# Patient Record
Sex: Female | Born: 1960 | Race: White | Hispanic: No | Marital: Married | Smoking: Never smoker
Health system: Southern US, Academic
[De-identification: ages and names within clinical notes are randomized; demographics above are authoritative.]

## PROBLEM LIST (undated history)

## (undated) DIAGNOSIS — C8590 Non-Hodgkin lymphoma, unspecified, unspecified site: Secondary | ICD-10-CM

## (undated) DIAGNOSIS — M159 Polyosteoarthritis, unspecified: Secondary | ICD-10-CM

## (undated) DIAGNOSIS — G4730 Sleep apnea, unspecified: Secondary | ICD-10-CM

## (undated) DIAGNOSIS — C801 Malignant (primary) neoplasm, unspecified: Secondary | ICD-10-CM

## (undated) DIAGNOSIS — D693 Immune thrombocytopenic purpura: Secondary | ICD-10-CM

## (undated) DIAGNOSIS — L439 Lichen planus, unspecified: Secondary | ICD-10-CM

## (undated) DIAGNOSIS — N200 Calculus of kidney: Secondary | ICD-10-CM

## (undated) DIAGNOSIS — Z22322 Carrier or suspected carrier of Methicillin resistant Staphylococcus aureus: Secondary | ICD-10-CM

## (undated) DIAGNOSIS — K219 Gastro-esophageal reflux disease without esophagitis: Secondary | ICD-10-CM

## (undated) DIAGNOSIS — M129 Arthropathy, unspecified: Secondary | ICD-10-CM

## (undated) DIAGNOSIS — K911 Postgastric surgery syndromes: Secondary | ICD-10-CM

## (undated) HISTORY — PX: HX GALL BLADDER SURGERY/CHOLE: SHX55

## (undated) HISTORY — PX: HX HYSTERECTOMY: SHX81

---

## 2018-09-23 ENCOUNTER — Other Ambulatory Visit (HOSPITAL_COMMUNITY): Payer: Self-pay

## 2020-05-03 IMAGING — CT CT ABDOMEN & PELVIS WITH DYE
3 of 7 series · 13 of 46 positions shown, 19 images · IV contrast (CONTRAST)
Comparison: CT abdomen pelvis report from outside facility dated 09/01/2019.

﻿EXAM:  CT ABDOMEN & PELVIS WITH DYE
INDICATION: Non-Hodgkin's lymphoma.
TECHNIQUE: Axial CT imaging of the abdomen and pelvis was performed following intravenous administration of 100 mL of Optiray 300. Oral contrast was also administered. Images were reviewed in multiple windows and projections. Exam was performed using 1 or more of the following dose reduction techniques: Automated exposure control, adjustment of the mA and/or kV according to patient size, or the use of iterative reconstruction technique.

[ven · axial · 0.84mm/px · z∈[-1300,-926]mm · 9 of 235 slices shown, 15 images]
[im 24/235  soft-tissue]
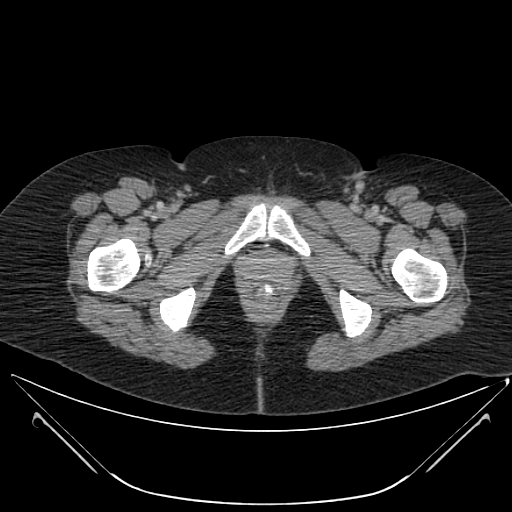
[im 24/235  bone]
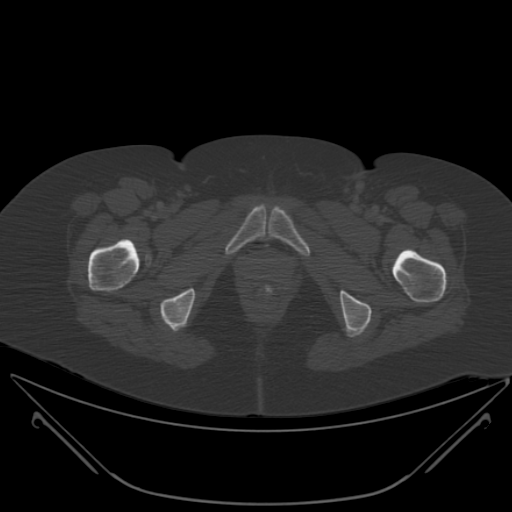
[im 47/235  soft-tissue]
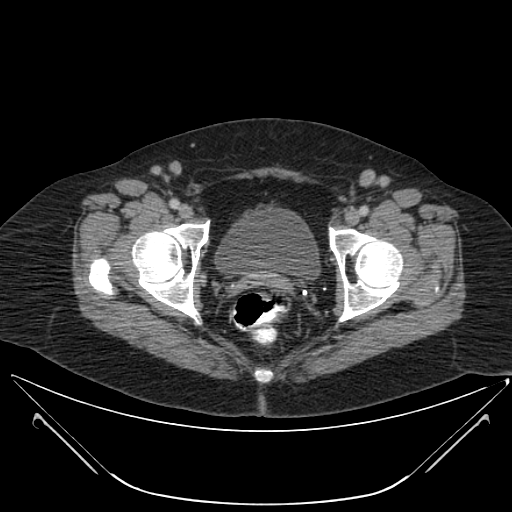
[im 71/235  soft-tissue]
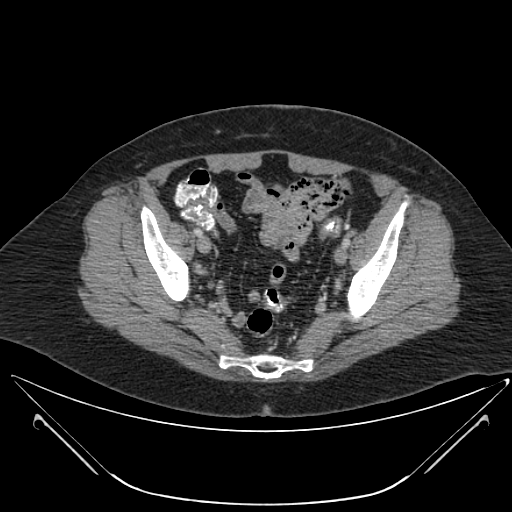
[im 94/235  soft-tissue]
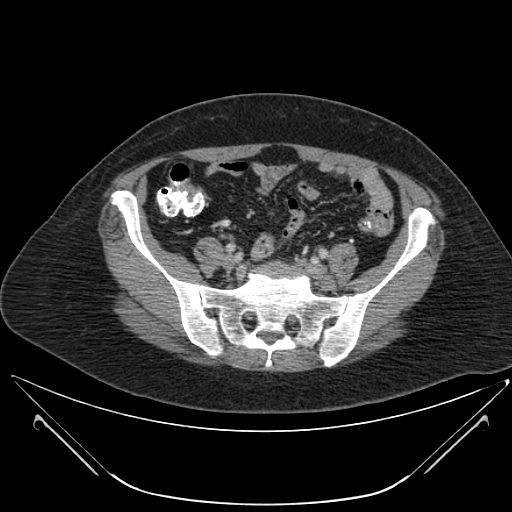
[im 118/235  soft-tissue]
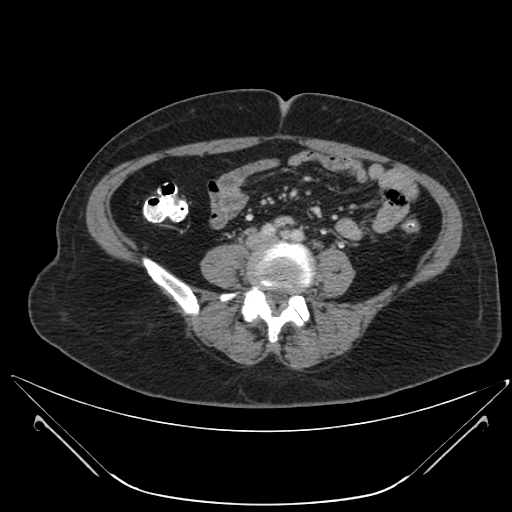
[im 141/235  soft-tissue]
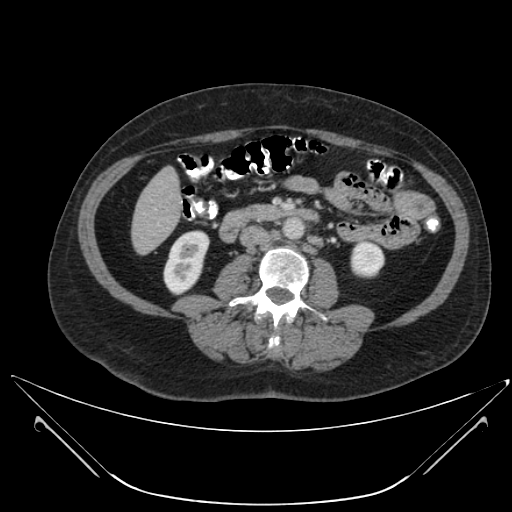
[im 141/235  lung]
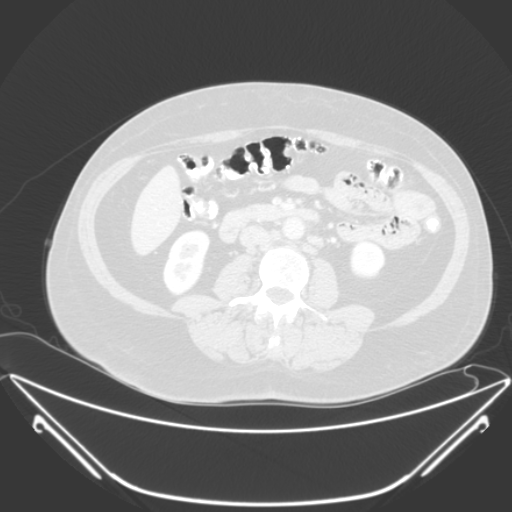
[im 164/235  soft-tissue]
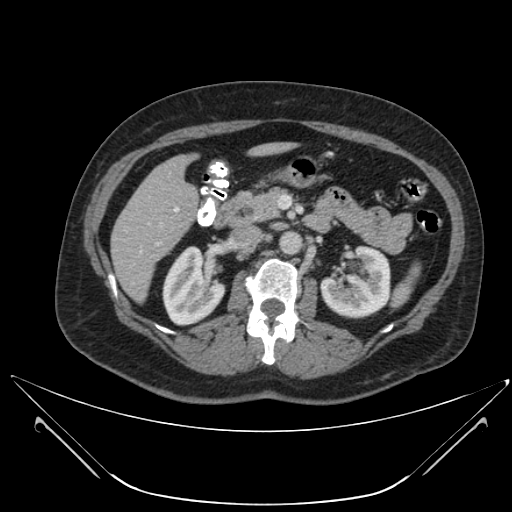
[im 164/235  lung]
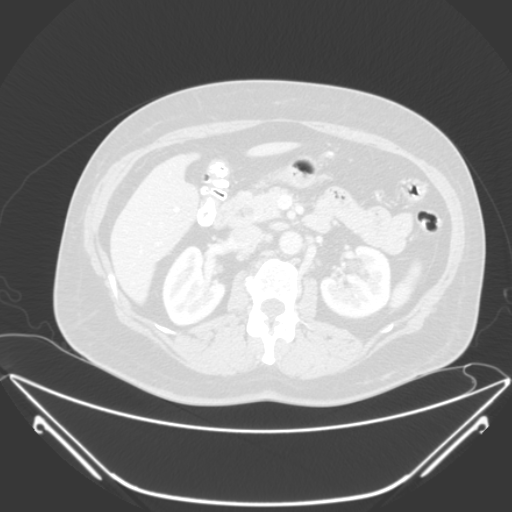
[im 188/235  soft-tissue]
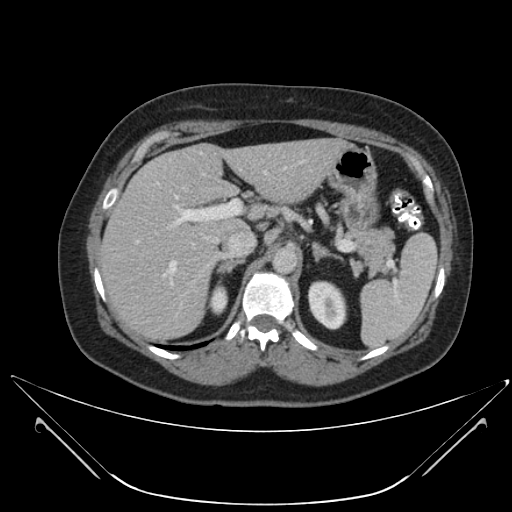
[im 188/235  lung]
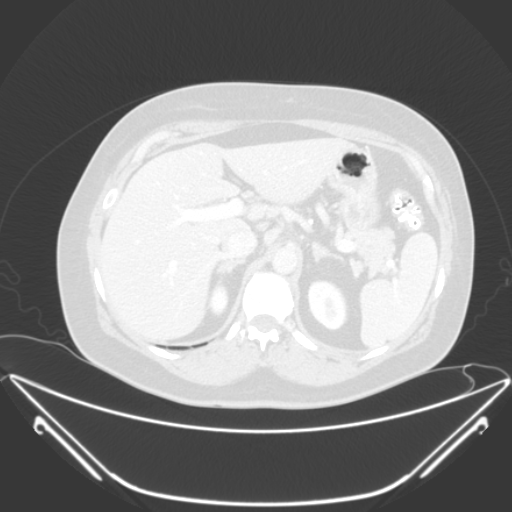
[im 211/235  soft-tissue]
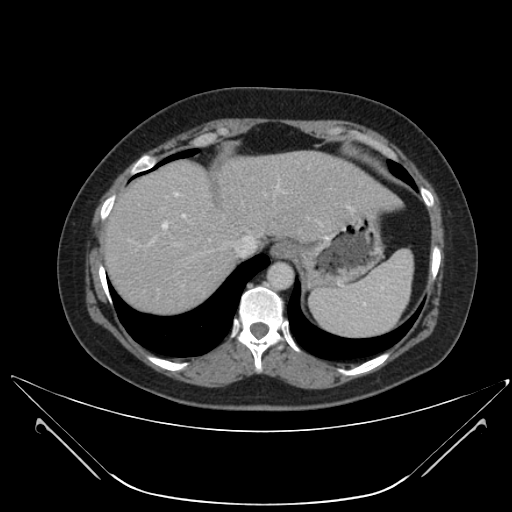
[im 211/235  lung]
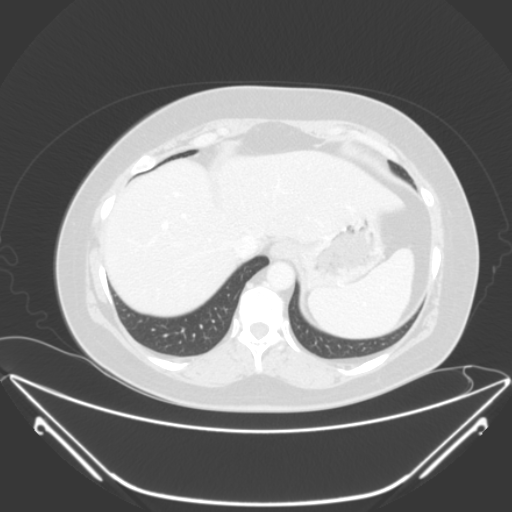
[im 211/235  bone]
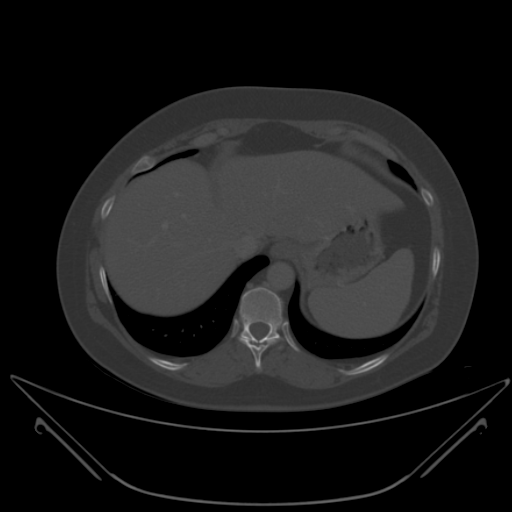

[cor · coronal · 0.99mm/px · 3 of 68 slices shown]
[im 23/68  soft-tissue]
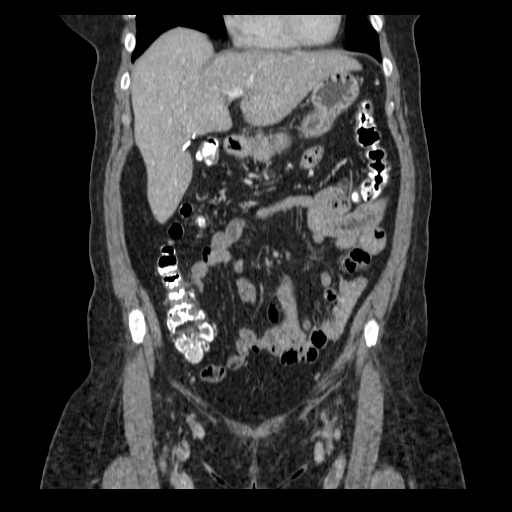
[im 30/68  soft-tissue]
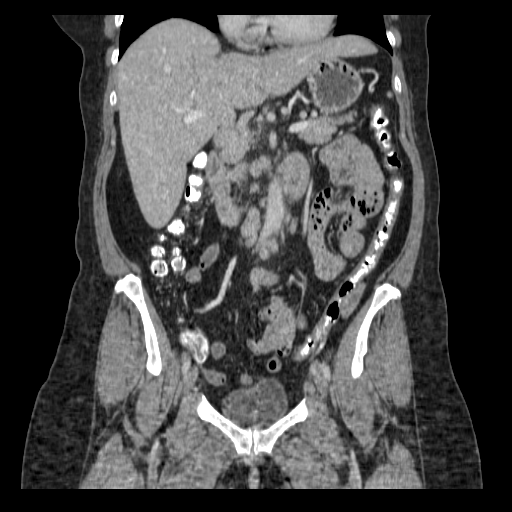
[im 38/68  soft-tissue]
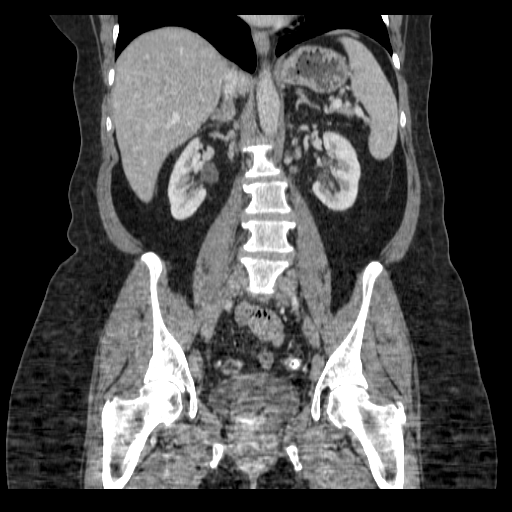

[sag · sagittal · 0.99mm/px · 1 of 97 slices shown]
[im 33/97  soft-tissue]
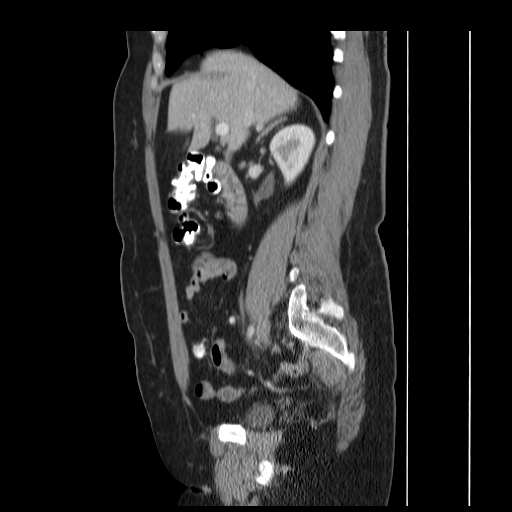

[13 of 46 positions shown; findings below may reference images not displayed]

FINDINGS: Limited visualization of lung bases is unremarkable. There is no pleural or pericardial effusion.

Gallbladder is surgically absent. Liver, spleen, pancreas, adrenal glands and kidneys are normal.

Bowel loops are normal in course and caliber, there is no obstruction or free air. Appendix is normal. 

There is mild retroperitoneal adenopathy, measuring up to 2.1 x 1.0 cm within the right para-aortic region distally. There is abnormal pelvic sidewall adenopathy, measuring up to 2.0 x 3.5 cm on the right and 2.6 x 2.0 cm on the left side. There is also abnormal bilateral inguinal adenopathy, measuring up to 2.1 x 1.7 cm on the right and 3.0 x 1.6 cm on the left side. There is no ascites. There are mild multilevel arthritic changes within the lumbar spine.
IMPRESSION: 1. Abnormal retroperitoneal, pelvic sidewall and inguinal adenopathy as detailed above. 

2. No acute abnormality within the upper abdomen and pelvis.

## 2021-09-24 ENCOUNTER — Other Ambulatory Visit (HOSPITAL_COMMUNITY): Payer: Self-pay

## 2021-09-24 DIAGNOSIS — M542 Cervicalgia: Secondary | ICD-10-CM

## 2021-10-02 ENCOUNTER — Other Ambulatory Visit (HOSPITAL_COMMUNITY): Payer: Self-pay

## 2021-10-02 DIAGNOSIS — C8208 Follicular lymphoma grade I, lymph nodes of multiple sites: Secondary | ICD-10-CM

## 2021-10-11 ENCOUNTER — Other Ambulatory Visit: Payer: Self-pay

## 2021-10-11 ENCOUNTER — Encounter (HOSPITAL_COMMUNITY): Payer: Self-pay

## 2021-10-11 ENCOUNTER — Ambulatory Visit (HOSPITAL_COMMUNITY)
Admission: RE | Admit: 2021-10-11 | Discharge: 2021-10-11 | Disposition: A | Discharge status: Still In Hospital | Payer: 59 | Source: Ambulatory Visit

## 2021-10-11 DIAGNOSIS — M542 Cervicalgia: Secondary | ICD-10-CM | POA: Insufficient documentation

## 2021-10-11 DIAGNOSIS — G8929 Other chronic pain: Secondary | ICD-10-CM | POA: Insufficient documentation

## 2021-10-11 DIAGNOSIS — T84216A Breakdown (mechanical) of internal fixation device of vertebrae, initial encounter: Secondary | ICD-10-CM | POA: Insufficient documentation

## 2021-10-11 DIAGNOSIS — M545 Low back pain, unspecified: Secondary | ICD-10-CM | POA: Insufficient documentation

## 2021-10-16 ENCOUNTER — Other Ambulatory Visit: Payer: Self-pay

## 2021-10-16 ENCOUNTER — Ambulatory Visit (HOSPITAL_BASED_OUTPATIENT_CLINIC_OR_DEPARTMENT_OTHER)
Admission: RE | Admit: 2021-10-16 | Discharge: 2021-10-16 | Disposition: A | Discharge status: Home / Self Care | Payer: Self-pay | Source: Ambulatory Visit

## 2021-10-16 ENCOUNTER — Ambulatory Visit
Admission: RE | Admit: 2021-10-16 | Discharge: 2021-10-16 | Disposition: A | Discharge status: Home / Self Care | Payer: Self-pay | Source: Ambulatory Visit

## 2021-10-16 ENCOUNTER — Ambulatory Visit (HOSPITAL_BASED_OUTPATIENT_CLINIC_OR_DEPARTMENT_OTHER): Payer: Self-pay

## 2021-10-16 DIAGNOSIS — C8208 Follicular lymphoma grade I, lymph nodes of multiple sites: Secondary | ICD-10-CM | POA: Insufficient documentation

## 2021-10-16 LAB — CREATININE
CREATININE: 0.84 mg/dL (ref 0.55–1.02)
ESTIMATED GFR: 80 mL/min/{1.73_m2} (ref >59)

## 2021-10-29 ENCOUNTER — Other Ambulatory Visit (HOSPITAL_COMMUNITY): Payer: Self-pay

## 2021-10-29 DIAGNOSIS — Z1231 Encounter for screening mammogram for malignant neoplasm of breast: Secondary | ICD-10-CM

## 2021-11-01 ENCOUNTER — Ambulatory Visit (HOSPITAL_COMMUNITY)
Admission: RE | Admit: 2021-11-01 | Discharge: 2021-11-01 | Disposition: A | Discharge status: Still In Hospital | Payer: 59 | Source: Ambulatory Visit

## 2021-11-01 ENCOUNTER — Other Ambulatory Visit: Payer: Self-pay

## 2021-11-01 DIAGNOSIS — M542 Cervicalgia: Secondary | ICD-10-CM

## 2021-11-01 DIAGNOSIS — T84216A Breakdown (mechanical) of internal fixation device of vertebrae, initial encounter: Secondary | ICD-10-CM

## 2021-11-01 DIAGNOSIS — M545 Low back pain, unspecified: Secondary | ICD-10-CM
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
## 2021-11-03 ENCOUNTER — Other Ambulatory Visit: Payer: Self-pay

## 2021-11-04 ENCOUNTER — Ambulatory Visit
Admission: RE | Admit: 2021-11-04 | Discharge: 2021-11-04 | Disposition: A | Discharge status: Still In Hospital | Payer: 59 | Source: Ambulatory Visit

## 2021-11-04 ENCOUNTER — Other Ambulatory Visit: Payer: Self-pay

## 2021-11-04 ENCOUNTER — Other Ambulatory Visit (HOSPITAL_COMMUNITY): Payer: Self-pay

## 2021-11-04 DIAGNOSIS — T84216A Breakdown (mechanical) of internal fixation device of vertebrae, initial encounter: Secondary | ICD-10-CM

## 2021-11-04 DIAGNOSIS — M542 Cervicalgia: Secondary | ICD-10-CM

## 2021-11-04 DIAGNOSIS — M545 Low back pain, unspecified: Secondary | ICD-10-CM

## 2021-11-04 NOTE — PT Treatment (Signed)
[facility]  Outpatient Physical Therapy  [place], [address]  [phone]  [phone]    Physical Therapy Treatment Note    Date: 11/04/2021  Patient's Name: Deborah Vega  Date of Birth: [birth year]            Visit #/POC: 2  Authorization:  [id number]      Evaluating Physical Therapist: [name] PT  PT diagnosis/Reason for Referral: Low back pain, Cervicalgia with prior h/o ACDF in 2013  Next Scheduled Physician Appointment: 11/07/21          Subjective: Pt reports constant headache. Rates 5/10.  Notes this average and constant.  Notes not sleeping well at all. Is starting pelvic PT at [facility] this week.  Also going to [place] for follow up on Cancer diagnosis soon.      Objective: Started with gentle manual therapies and exercise noted below.        EXERCISE/ACTIVITY NAME REPETITIONS RESISTANCE COMPLETED THIS DOS   Suboccipital release   na Manual, medium pressure yes   Cervical flexion/chin tuck isometric   5 sec x 7 na Yes; HEP   Horizontal abduction supine   10 red Yes; HEP   Anterior chest release, gentle overpressure   na manual yes                                                 Assessment: Pt tolerated treatment well.  Noted no headache at end of session.  She does have soft tissue restriction suboccipital and chest.  Forward head and forward shoulder posturing contributing to pain.  Postural weakness noted.      Plan: Will monitor effects of treatment and proceed accordingly    Short Term Goals: 4  Weeks      -Independent home program. ROM / Stabilization / Self care / Activity modification.       -Knowledge of proper body mechanics.       -Decrease in pain. Intermittent vs. constant. Worst SPS rating less than 7.       -Increase ROM / Strength: POWER GRIP NOT PAIN INHIBITED.   -Improve function. Decreased sleep disruption / Reduce HAS / Reduce radicular symptoms.             Long Term Goals: 6-8  Weeks       -Abolish radicular symptoms. Worst SPS rating  less than 5.  -Increase ROM / Strength to [facility] to allow normal body mechanics with all mobility.  -Improve function. Restorative sleep / Abolish HAS / tolerance of static posture not limited by pain.       -Other :  Sustained use of UEs not limited by neck pain.       Total Session Time 33 and Timed code minutes 33  THERAPEUTIC EXERCISE 17 minutes and JOINT MOBILIZATION/MFR 16 minutes      Lessly Stigler, PTA  11/04/2021, 08:55

## 2021-11-05 ENCOUNTER — Ambulatory Visit (HOSPITAL_COMMUNITY): Payer: Self-pay

## 2021-11-06 ENCOUNTER — Inpatient Hospital Stay
Admission: RE | Admit: 2021-11-06 | Discharge: 2021-11-06 | Disposition: A | Discharge status: Home / Self Care | Payer: 59 | Source: Ambulatory Visit

## 2021-11-06 ENCOUNTER — Other Ambulatory Visit: Payer: Self-pay

## 2021-11-06 ENCOUNTER — Other Ambulatory Visit (HOSPITAL_COMMUNITY): Payer: Self-pay

## 2021-11-06 ENCOUNTER — Encounter (HOSPITAL_COMMUNITY): Payer: Self-pay

## 2021-11-06 DIAGNOSIS — Z1231 Encounter for screening mammogram for malignant neoplasm of breast: Secondary | ICD-10-CM

## 2021-11-08 ENCOUNTER — Ambulatory Visit (HOSPITAL_COMMUNITY): Payer: Self-pay

## 2021-11-11 ENCOUNTER — Ambulatory Visit (HOSPITAL_COMMUNITY)
Admission: RE | Admit: 2021-11-11 | Discharge: 2021-11-11 | Disposition: A | Discharge status: Still In Hospital | Payer: 59 | Source: Ambulatory Visit

## 2021-11-11 ENCOUNTER — Other Ambulatory Visit: Payer: Self-pay

## 2021-11-11 DIAGNOSIS — T84216A Breakdown (mechanical) of internal fixation device of vertebrae, initial encounter: Secondary | ICD-10-CM | POA: Insufficient documentation

## 2021-11-11 DIAGNOSIS — M545 Low back pain, unspecified: Secondary | ICD-10-CM | POA: Insufficient documentation

## 2021-11-11 DIAGNOSIS — G8929 Other chronic pain: Secondary | ICD-10-CM | POA: Insufficient documentation

## 2021-11-11 DIAGNOSIS — M542 Cervicalgia: Secondary | ICD-10-CM | POA: Insufficient documentation

## 2021-11-11 NOTE — PT Treatment (Signed)
[facility]  Outpatient Physical Therapy  [place], [address]  [phone]  [phone]    Physical Therapy Treatment Note    Date: 11/11/2021  Patient's Name: Deborah Vega  Date of Birth: [birth year]            Visit #/POC: 2 / 12  Authorization:      Evaluating Physical Therapist: [name] PT   diagnosis/Reason for Referral: cervicalgia  Next Scheduled Physician Appointment: TBA          Subjective: THE PT HAS NO NEW C/0s. ShE EXPRESSES GOAL OF AVOIDING SURGERY. She HAS HAD MULTILEVEL MID CERVICAL FUSIONS.     Objective:       EXERCISE/ACTIVITY NAME REPETITIONS RESISTANCE COMPLETED THIS DOS   SUBCRANIAL INHIBITIVE DISTRACTION   NA MANUAL YES   C0/C1 FF JOINT MOB   REPEATED MANUAL, GRADE III YES   CO/C1 ACTIVE FLEXION SUPINE WITH MID CERVICAL SPINE STABILIZED   X 8 X 2 AROM YES  ADDED TO HEP 4 / 24   C0/C1 ISOMETRICS SUPINE   X 10X 2 MANUAL YES, ADDED TO HEP 4/24   LONG AXIS STRETCH OF LEVATOR SCAPULAE , POSTERIOR CERVICAL MM    NA MANUAL  YES                                         Assessment: THE PT INITIALLY HAD POOR KINESTHETIC AWARENESS OF SUBCRANIAL FLEXION ROM BUT IMPROVED WITH REPETITION. She DID NOT REPORT REPRODUCTION OF HA WITH MANUAL THERAPIES.    Short Term Goals: 4  Weeks      -Independent home program. ROM / Stabilization / Self care / Activity modification.       -Knowledge of proper body mechanics.       -Decrease in pain. Intermittent vs. constant. Worst SPS rating less than 7.       -Increase ROM / Strength: POWER GRIP NOT PAIN INHIBITED.   -Improve function. Decreased sleep disruption / Reduce HAS / Reduce radicular symptoms.         Long Term Goals: 6-8  Weeks       -Abolish radicular symptoms. Worst SPS rating less than 5.  -Increase ROM / Strength to [facility] to allow normal body mechanics with all mobility.  -Improve function. Restorative sleep / Abolish HAS / tolerance of static posture not limited by pain.       -Other :   Sustained use of UEs not limited by neck pain.       Plan:CONTINUE WITH ADDRESSING SUBCRANIAL AND UPPER THORACIC MOBILITY EXERCISES IN NWB.     Total Session Time 41 and Timed code minutes 36  JOINT MOBILIZATION/MFR 36 minutes      [name], PT  11/11/2021, 10:53

## 2021-11-14 ENCOUNTER — Ambulatory Visit (HOSPITAL_COMMUNITY): Payer: Self-pay

## 2021-11-20 ENCOUNTER — Ambulatory Visit
Admission: RE | Admit: 2021-11-20 | Discharge: 2021-11-20 | Disposition: A | Discharge status: Still In Hospital | Payer: 59 | Source: Ambulatory Visit

## 2021-11-20 ENCOUNTER — Other Ambulatory Visit: Payer: Self-pay

## 2021-11-20 NOTE — PT Treatment (Signed)
[facility]  Outpatient Physical Therapy  [place], [address]  [phone]  [phone]    Physical Therapy Treatment Note    Date: 11/20/2021  Patient's Name: Deborah Vega  Date of Birth: 02-19-1961            Visit #/POC:2 / 45  Authorization: 2 / 98      Evaluating Physical Therapist:Halayna Blane PT  PT diagnosis/Reason for Referral: CERVICAL RADICULOPATHY / CERVICALGIA  Next Scheduled Physician Appointment: TBA          Subjective: she has had a rough week  Due to GI issues ; She is having more LBP today which makes her legs weak. She blames the cold weather. She uses a rollator for community ambulation now because she does not trust her legs to hold her. Her neck is feeling pretty good but she notes pain in her central thoracic spine.  SPS = 6  In regards to mid scapular pain. She has no ill effect from therapy. She is sleeping better with use of her new buckwheat pillow.    Objective:     EXERCISE/ACTIVITY NAME REPETITIONS RESISTANCE COMPLETED THIS DOS   SUBCRANIAL INHIBITIVE DISTRACTION   NA MANUAL YES   C0/C1 FF JOINT MOB   REPEATED MANUAL, GRADE III NO   CO/C1 ACTIVE FLEXION SUPINE WITH MID CERVICAL SPINE STABILIZED   X 8 X 2 AROM NO,  ADDED TO HEP 4 / 24   C0/C1 ISOMETRICS SUPINE   X 10X 2 MANUAL NO, ADDED TO HEP 4/24   LONG AXIS STRETCH OF LEVATOR SCAPULAE , POSTERIOR CERVICAL MM    NA MANUAL BILAT YES     SUPINE THORACIC MOBILITY EXERCISES WITH WAND, TRIPLANAR  30 TOTAL NA YES, ADDED TO HEP WITH HANDOUTS 5/23   SUPINE PECTORAL STRETCH WITH OVERPRESSURE   30 SEC X 5 TO TOLERANCE YES, ADDED TO HEP   SUPINE POSITIONAL ANTERIOR CHEST OPENER, STATIC  HOME INSTRUCTIONS HUMERAL AND ELBOW AT 20 DEGREES ABD/FLEXION WITH WAND NA YES, ADDED TO HEP 11/20/21                 Assessment: THE PT HAD GOOD TOLERANCE TO NWB ACTIVE AND PASSIVE MOBILITY EXERCISES. She HIGHLY MOTIVATED TO PARTICIPATE IN THERAPY.    Short Term  Goals:4Weeks  -Independent home program. ROM / Stabilization / Self care / Activity modification.  -Knowledge of proper body mechanics.  -Decrease in pain. Intermittent vs. constant. Worst SPS rating less than 7.  -Increase ROM / Strength: POWER GRIP NOT PAIN INHIBITED.  -Improve function. Decreased sleep disruption / Reduce HAS / Reduce radicular symptoms.    Long Term Goals:6-8Weeks  -Abolish radicular symptoms. Worst SPS rating less than 5.  -Increase ROM / Strength to [facility] to allow normal body mechanics with all mobility.  -Improve function. Restorative sleep / Abolish HAS / tolerance of static posture not limited by pain.  -Other : Sustained use of UEs not limited by neck pain.     Plan: CONTINUE WITH NWB CERVICAL / THORACIC MOBILITY AND STABILITY EXERCISES    Total Session Time 27 and Timed code minutes 27  THERAPEUTIC EXERCISE 15 minutes and JOINT MOBILIZATION/MFR 12 minutes      [name], PT  11/20/2021, 1010

## 2021-11-22 ENCOUNTER — Ambulatory Visit (HOSPITAL_COMMUNITY): Payer: Self-pay

## 2021-11-27 ENCOUNTER — Ambulatory Visit (HOSPITAL_COMMUNITY): Payer: Self-pay

## 2021-11-29 ENCOUNTER — Ambulatory Visit (HOSPITAL_COMMUNITY): Payer: Self-pay

## 2021-12-10 ENCOUNTER — Other Ambulatory Visit: Payer: Self-pay

## 2021-12-10 ENCOUNTER — Ambulatory Visit (HOSPITAL_COMMUNITY)
Admission: RE | Admit: 2021-12-10 | Discharge: 2021-12-10 | Disposition: A | Discharge status: Still In Hospital | Payer: 59 | Source: Ambulatory Visit

## 2021-12-10 DIAGNOSIS — M545 Low back pain, unspecified: Secondary | ICD-10-CM | POA: Insufficient documentation

## 2021-12-10 DIAGNOSIS — G8929 Other chronic pain: Secondary | ICD-10-CM | POA: Insufficient documentation

## 2021-12-10 DIAGNOSIS — M542 Cervicalgia: Secondary | ICD-10-CM | POA: Insufficient documentation

## 2021-12-10 DIAGNOSIS — T84216A Breakdown (mechanical) of internal fixation device of vertebrae, initial encounter: Secondary | ICD-10-CM | POA: Insufficient documentation

## 2021-12-10 NOTE — PT Treatment (Addendum)
[facility]  Outpatient Physical Therapy  [place], [address]  [phone]  [phone]    Physical Therapy Treatment Note    Date: 12/10/2021  Patient's Name: Deborah Vega  Date of Birth: 1961/02/05    Visit #/POC:5 / 22  Authorization: 5/ 15      Evaluating Physical Therapist:Davion Meara PT  PT diagnosis/Reason for Referral: CERVICAL RADICULOPATHY / CERVICALGIA  Next Scheduled Physician Appointment: TBA      S:  her neck is less locked up . Her movement is better. Average SPS is 4 lately. Her pain is intermittent  And associated with cervical flexion ( static) .         Objective:     EXERCISE/ACTIVITY NAME REPETITIONS RESISTANCE COMPLETED THIS DOS   SUBCRANIAL INHIBITIVE DISTRACTION   NA MANUAL YES   C0/C1 FF JOINT MOB   REPEATED MANUAL, GRADE III yes   CO/C1 ACTIVE FLEXION SUPINE WITH MID CERVICAL SPINE STABILIZED   X 8 X 2 AROM NO,  ADDED TO HEP 4 / 24   C0/C1 ISOMETRICS SUPINE   X 10X 2 MANUAL Yes with theraband pull, changing resistance in supine and seated positions; progressed 5/23   LONG AXIS STRETCH OF LEVATOR SCAPULAE , POSTERIOR CERVICAL MM    NA MANUAL BILAT YES     SUPINE THORACIC MOBILITY EXERCISES WITH WAND, TRIPLANAR  30 TOTAL NA no, ADDED TO HEP WITH HANDOUTS 5/23   SUPINE PECTORAL STRETCH WITH OVERPRESSURE   30 SEC X 5 TO TOLERANCE no, ADDED TO HEP   SUPINE POSITIONAL ANTERIOR CHEST OPENER, STATIC  HOME INSTRUCTIONS HUMERAL AND ELBOW AT 90 DEGREES ABD/FLEXION WITH WAND NA no, ADDED TO HEP 11/20/21                 Assessment: THE PT  REPORTS IMPROVEMENT WITH THERAPY. She IS PRE OCCUPIED WITH HER OTHER HEALTH ISSUES AFFECTING HER GI TRACT.  ShE EXHIBITS NO PAIN BEHAVIOR DURING SESSION ;    Short term goals;    Indep in HEP  Of :  Stabilization / Self care / Activity modification.  -Knowledge of proper body mechanics.  -Decrease in pain. Intermittent vs. constant. Worst SPS rating less than 7. ( met  5/ 23/23)  -Increase ROM / Strength: POWER GRIP NOT PAIN INHIBITED.  -Improve function. Decreased sleep disruption / Reduce HAS / Reduce radicular symptoms.    Long Term Goals:6-8Weeks  -Abolish radicular symptoms. Worst SPS rating less than 5.  -Increase ROM / Strength to [facility] to allow normal body mechanics with all mobility.  -Improve function. Restorative sleep / Abolish HAS / tolerance of static posture not limited by pain.  -Other : Sustained use of UEs not limited by neck pain.     Plan: CONTINUE WITH NWB CERVICAL / THORACIC MOBILITY AND STABILITY EXERCISES. PROGRESS DORSAL SCAPULAR STRENGTHENING.     Total Session Time 34 and Timed code minutes 34  THERAPEUTIC EXERCISE 20 minutes and JOINT MOBILIZATION/MFR 15 minutes      [name], PT  12/10/2021, 11:01      This pt's original POC will end 12/20/21. She received an update RX from her MD on 12/19/21. The pt is progressing and would like to extend her POC. Her current authorization is good through 04/24/22. To date she has completed 6 weeks of an 8 week POC. I am extending the pt's POC for an additional  6 weeks from 12/19/21 ( 12 sessions). See the above note for the  patient's status upon 12/10/21.   Start of Service:     Re-certification:           From:    Through:       I have reviewed this plan of treatment and re-certify a continuing need for service.    Referring Provider Signature:       Date:        Treatment Diagnosis:

## 2021-12-18 ENCOUNTER — Ambulatory Visit (HOSPITAL_COMMUNITY): Payer: Self-pay

## 2021-12-20 ENCOUNTER — Ambulatory Visit (HOSPITAL_COMMUNITY): Payer: Self-pay

## 2021-12-25 ENCOUNTER — Ambulatory Visit (HOSPITAL_COMMUNITY): Payer: Self-pay

## 2021-12-25 ENCOUNTER — Ambulatory Visit (HOSPITAL_COMMUNITY)
Admission: RE | Admit: 2021-12-25 | Discharge: 2021-12-25 | Disposition: A | Discharge status: Still In Hospital | Payer: 59 | Source: Ambulatory Visit

## 2021-12-25 ENCOUNTER — Other Ambulatory Visit: Payer: Self-pay

## 2021-12-25 NOTE — PT Treatment (Addendum)
[facility]  Outpatient Physical Therapy  [place], [address]  [phone]  [phone]    Physical Therapy Treatment Note    Date: 12/25/2021  Patient's Name: Deborah Vega  Date of Birth: [birth year]    Visit #/POC: 6/15  Authorization: 6/15      Evaluating Physical Therapist:Kota Vega PT  PT diagnosis/Reason for Referral: CERVICAL RADICULOPATHY / CERVICALGIA  Next Scheduled Physician Appointment: TBA     On 12/19/21 we received a faxed RX for the pt to continue treatment ( evaluate and rx) . The pt's POC is extended for 6 weeks from 12/19/21. We will continue with dorsal cervical / scapular strengthening and MFR. Deborah Vega, PT    S:  States that what Deborah Vega did last visit did help. Neck is feeling pretty good.     Objective:     EXERCISE/ACTIVITY NAME REPETITIONS RESISTANCE COMPLETED THIS DOS   SUBCRANIAL INHIBITIVE DISTRACTION   NA MANUAL YES   C0/C1 FF JOINT MOB   REPEATED MANUAL, GRADE III yes   CO/C1 ACTIVE FLEXION SUPINE WITH MID CERVICAL SPINE STABILIZED   X 8 X 2 AROM NO,  ADDED TO HEP 4 / 24   C0/C1 ISOMETRICS SUPINE   X 10X 2 MANUAL Yes with theraband pull, changing resistance in supine and seated positions; progressed 5/23   LONG AXIS STRETCH OF LEVATOR SCAPULAE , POSTERIOR CERVICAL MM    NA MANUAL BILAT YES     SUPINE THORACIC MOBILITY EXERCISES WITH WAND, TRIPLANAR  30 TOTAL NA no, ADDED TO HEP WITH HANDOUTS 5/23   SUPINE PECTORAL STRETCH WITH OVERPRESSURE   30 SEC X 5 TO TOLERANCE no, ADDED TO HEP   SUPINE POSITIONAL ANTERIOR CHEST OPENER, STATIC  HOME INSTRUCTIONS HUMERAL AND ELBOW AT 90 DEGREES ABD/FLEXION WITH WAND NA no, ADDED TO HEP 11/20/21   Supine scapular retraction with theraband   2 x 10 Red YES         Assessment: Pt tolerated today's session well with no complaints noted.      Short term goals;    Indep in HEP  Of :  Stabilization / Self care / Activity modification.  -Knowledge of proper body  mechanics.  -Decrease in pain. Intermittent vs. constant. Worst SPS rating less than 7. ( met 5/ 23/23)  -Increase ROM / Strength: POWER GRIP NOT PAIN INHIBITED.  -Improve function. Decreased sleep disruption / Reduce HAS / Reduce radicular symptoms.    Long Term Goals:6-8Weeks  -Abolish radicular symptoms. Worst SPS rating less than 5.  -Increase ROM / Strength to [facility] to allow normal body mechanics with all mobility.  -Improve function. Restorative sleep / Abolish HAS / tolerance of static posture not limited by pain.  -Other : Sustained use of UEs not limited by neck pain.     Plan: CONTINUE WITH NWB CERVICAL / THORACIC MOBILITY AND STABILITY EXERCISES. PROGRESS DORSAL SCAPULAR STRENGTHENING.     Total Session Time 35 and Timed code minutes 35  THERAPEUTIC EXERCISE 20 minutes and JOINT MOBILIZATION/MFR 15 minutes      Deborah Vega, PTA

## 2021-12-27 ENCOUNTER — Ambulatory Visit (HOSPITAL_COMMUNITY): Payer: Self-pay

## 2021-12-30 ENCOUNTER — Ambulatory Visit (HOSPITAL_COMMUNITY)
Admission: RE | Admit: 2021-12-30 | Discharge: 2021-12-30 | Disposition: A | Discharge status: Still In Hospital | Payer: 59 | Source: Ambulatory Visit

## 2021-12-30 ENCOUNTER — Other Ambulatory Visit: Payer: Self-pay

## 2021-12-30 NOTE — PT Treatment (Signed)
[facility]  Outpatient Physical Therapy  [place], [address]  [phone]  [phone]    Physical Therapy Treatment Note    Date: 12/30/2021  Patient's Name: Deborah Vega  Date of Birth: Oct 02, 1960    Visit #/POC: 7/15  Authorization: 7/15      Evaluating Physical Therapist:KAY LAZENBY PT  PT diagnosis/Reason for Referral: CERVICAL RADICULOPATHY / CERVICALGIA  Next Scheduled Physician Appointment: TBA      S:  Pt reports that she did ok after last visit. She notes that she did have to put her TENS unit on the R side of her neck to help loosen up the muscles but it is still very tight today.    Objective: MFR per flow sheet below    EXERCISE/ACTIVITY NAME REPETITIONS RESISTANCE COMPLETED THIS DOS   SUBCRANIAL INHIBITIVE DISTRACTION   NA MANUAL YES   C0/C1 FF JOINT MOB   REPEATED MANUAL, GRADE III yes   CO/C1 ACTIVE FLEXION SUPINE WITH MID CERVICAL SPINE STABILIZED   X 8 X 2 AROM NO,  ADDED TO HEP 4 / 24   C0/C1 ISOMETRICS SUPINE   X 10X 2 MANUAL Yes with theraband pull, changing resistance in supine and seated positions; progressed 5/23   LONG AXIS STRETCH OF LEVATOR SCAPULAE , POSTERIOR CERVICAL MM    NA MANUAL BILAT YES     SUPINE THORACIC MOBILITY EXERCISES WITH WAND, TRIPLANAR  30 TOTAL NA no, ADDED TO HEP WITH HANDOUTS 5/23   SUPINE PECTORAL STRETCH WITH OVERPRESSURE   30 SEC X 5 TO TOLERANCE no, ADDED TO HEP   SUPINE POSITIONAL ANTERIOR CHEST OPENER, STATIC  HOME INSTRUCTIONS HUMERAL AND ELBOW AT 90 DEGREES ABD/FLEXION WITH WAND NA no, ADDED TO HEP 11/20/21   Trigger point release UT   Yes   Supine scapular retraction with theraband   2 x 10 Red No added to HEP 12/25/21          Assessment: Tightness appreciated in R UT today     Short term goals;    Indep in HEP  Of :  Stabilization / Self care / Activity modification.  -Knowledge of proper body mechanics.  -Decrease in pain. Intermittent vs. constant. Worst SPS  rating less than 7. ( met 5/ 23/23)  -Increase ROM / Strength: POWER GRIP NOT PAIN INHIBITED.  -Improve function. Decreased sleep disruption / Reduce HAS / Reduce radicular symptoms.    Long Term Goals:6-8Weeks  -Abolish radicular symptoms. Worst SPS rating less than 5.  -Increase ROM / Strength to [facility] to allow normal body mechanics with all mobility.  -Improve function. Restorative sleep / Abolish HAS / tolerance of static posture not limited by pain.  -Other : Sustained use of UEs not limited by neck pain.     Plan: CONTINUE WITH NWB CERVICAL / THORACIC MOBILITY AND STABILITY EXERCISES. PROGRESS DORSAL SCAPULAR STRENGTHENING.     Total Session Time 30 and Timed code 30 minutes   JOINT MOBILIZATION/MFR 30 minute      Omero Kowal, PTA

## 2022-01-01 ENCOUNTER — Ambulatory Visit (HOSPITAL_COMMUNITY)
Admission: RE | Admit: 2022-01-01 | Discharge: 2022-01-01 | Disposition: A | Discharge status: Still In Hospital | Payer: 59 | Source: Ambulatory Visit

## 2022-01-01 ENCOUNTER — Other Ambulatory Visit: Payer: Self-pay

## 2022-01-01 NOTE — PT Treatment (Signed)
[facility]  Outpatient Physical Therapy  [place], [address]  [phone]  [phone]    Physical Therapy Treatment Note    Date: 01/01/2022  Patient's Name: Deborah Vega  Date of Birth: [birth year]    Visit #/POC:9 / 15  Authorization: 9/ 15      Evaluating Physical Therapist:Shelvie Salsberry PT  PT diagnosis/Reason for Referral: CERVICAL RADICULOPATHY / CERVICALGIA  Next Scheduled Physician Appointment: TBA      S: She missed one week of therapy while waiting on the [place] to provide an updated RX for therapy. Herer neck is less locked up since  Soc and her movement is better. Average SPS is 4 lately. She has CC of soreness from stretching. Her original pain is less intense and intermittent . She has cramping in bilat hands ( from wrist down) . Her worst SPS  =6  And this was a result of  gardening  ( raking). Her gardening activities where done in standing position. Her sleep is disrupted but not by neck pain. 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 Decompression surgery. She is right dominant.     Objective: reassessment and assessment of right shoulder rotator cuff length and elbow ROM.     EXERCISE/ACTIVITY NAME REPETITIONS RESISTANCE COMPLETED THIS DOS   SUBCRANIAL INHIBITIVE DISTRACTION   NA MANUAL YES   C0/C1 FF JOINT MOB   REPEATED MANUAL, GRADE III yes   CO/C1 ACTIVE FLEXION SUPINE WITH MID CERVICAL SPINE STABILIZED   X 8 X 2 AROM NO,  ADDED TO HEP 4 / 24   C0/C1 ISOMETRICS SUPINE   X 10X 2 MANUAL No, , part of HEP  progressed 5/23   LONG AXIS STRETCH OF LEVATOR SCAPULAE , POSTERIOR CERVICAL MM    NA MANUAL BILAT YES     SUPINE THORACIC MOBILITY EXERCISES WITH WAND, TRIPLANAR  30 TOTAL NA no, ADDED TO HEP WITH HANDOUTS 5/23   SUPINE PECTORAL STRETCH WITH OVERPRESSURE   30 SEC X 5 TO TOLERANCE no, ADDED TO HEP   SUPINE POSITIONAL ANTERIOR CHEST OPENER, STATIC  HOME INSTRUCTIONS HUMERAL AND ELBOW AT 90  DEGREES ABD/FLEXION WITH WAND NA no, ADDED TO HEP 11/20/21     right infraspinatus / teres minor stretch to increase passive IR ROM MANUAL TO TOLERANCE YES         Assessment: THE PT  REPORTS IMPROVEMENT WITH THERAPY.    ; Her sleep is disrupted but not by neck pain. Her HA  Is triggered by cervical flexion  Activities. She feels this is due to increase activity using her right arm with gardening activities. The pt exhibits adaptive shortening of her rc muscles limiting IR ROM ( 40 degrees IR before onset of scapular movement) and she has a right elbow flexion contracture  ( - 10-15 degrees from full extension). The loss of shoulder IR and elbow extension ROM will cause excessive scapular elevation, protraction and superior tilt causing overuse of levator scapulae. The pt's c/o cervical strain ( right sided) and ha are likely caused by altered mechanics in shoulder complex.   Short term goals;    Indep in HEP  Of :  Stabilization / Self care / Activity modification. ( met 01/01/22)  -Knowledge of proper body mechanics.  -Decrease in pain. Intermittent vs. constant. Worst SPS rating less than 7. ( met 5/ 23/23)  -Increase ROM / Strength: POWER GRIP NOT PAIN INHIBITED.  -Improve function.  Decreased sleep disruption / Reduce HAS / Reduce radicular symptoms. ( met in part 01/01/22)    Long Term Goals:6-8Weeks  -Abolish radicular symptoms. Worst SPS rating less than 5.  -Increase ROM / Strength to [facility] to allow normal body mechanics with all mobility.  -Improve function. Restorative sleep / Abolish HAS / tolerance of static posture not limited by pain.  -Other : Sustained use of UEs not limited by neck pain.     Plan: start stretching of rotator cuff to improve disassociation of Peridot joint with active [place] IR and with elbow extension. Teach pt rotator cuff  IR stretch and elbow extension stretch.               Total Session Time 33 and Timed code minutes 33  THERAPEUTIC EXERCISE 33  minutes      [name], PT  01/01/2022, 12:00

## 2022-01-07 ENCOUNTER — Ambulatory Visit
Admission: RE | Admit: 2022-01-07 | Discharge: 2022-01-07 | Disposition: A | Discharge status: Still In Hospital | Payer: 59 | Source: Ambulatory Visit

## 2022-01-07 ENCOUNTER — Other Ambulatory Visit: Payer: Self-pay

## 2022-01-07 NOTE — PT Treatment (Signed)
[facility]  Outpatient Physical Therapy  [place], [address]  [phone]  [phone]    Physical Therapy Treatment Note    Date: 01/07/2022  Patient's Name: Deborah Vega  Date of Birth: 03-14-1961      Visit #/POC:10 / 15 per original POC.   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 She HAS WIDESPREAD JOINT PAIN TODAY DUE TO RAIN. She HAS HAD AN ABDOMINAL CT SCAN WITH CONTRAST  AND A SLEEP STUDY . She FEELS LIKE HER IMPROVEMENT WITH THERAPY HAS NOT BEEN JUST TEMPORARY.     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 THE PT HAS APPROXIMATELY 45 DEGREES OF SUPINATION.  THE PT HAS TIGHTNESS OF MCL OF R ELBOW.     EXERCISE/ACTIVITY NAME REPETITIONS RESISTANCE COMPLETED THIS DOS   SUBCRANIAL INHIBITIVE DISTRACTION   NA MANUAL YES   C0/C1 FF JOINT MOB   REPEATED MANUAL, GRADE III yes   CO/C1 ACTIVE FLEXION SUPINE WITH MID CERVICAL SPINE STABILIZED   X 8 X 2 AROM NO, ADDED TO HEP 4 / 24   C0/C1 ISOMETRICS SUPINE   X 10X 2 MANUAL No, , part of HEP  progressed 5/23   LONG AXIS STRETCH OF LEVATOR SCAPULAE , POSTERIOR CERVICAL MM    NA MANUAL BILAT YES     SUPINE THORACIC MOBILITY EXERCISES WITH WAND, TRIPLANAR  30 TOTAL NA no, ADDED TO HEP WITH HANDOUTS 5/23   SUPINE PECTORAL STRETCH WITH OVERPRESSURE   30 SEC X 5 TO TOLERANCE no, ADDED TO HEP   SUPINE POSITIONAL ANTERIOR CHEST OPENER, STATIC  HOME INSTRUCTIONS HUMERAL AND ELBOW AT 90 DEGREES ABD/FLEXION WITH WAND NA no, ADDED TO HEP 11/20/21   PASSIVE STRETCH OF R ELBOW MCL NA MANUAL OVERPRESSURE TO TOLERANCE YES   RADIOULNAR JT MOB TO INCREASE WRIST  SUPINATION ROM  MANUAL, GRADE III YES   SUPINE TRICEP CURLS, SHOULDER IN END RANGE FLEXION  3 LBS YES, ADDED TO HEP 6/20   ULNOHUMERAL JT MOBILIZATION  TO INCREASE EXTENSION NA GRADE II AND III YES     right infraspinatus / teres minor stretch to increase passive IR ROM MANUAL TO TOLERANCE NO     JAMAR   R = 60 LBS    50 LBS LEFT    Added to hep: supine or standing end range elbow extension with shoulder supported at end range flexion to prevent compensatory mechanics proximally.   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 THIS WILL CONTRIBUTE TO CERVICAL MYOFASCIAL PAIN DUE TO COMPENSATORY MECHANICS WHICH ARE CHRONIC POST AN OLD ELBOW FRACTURE AND SURGERY. TERMINAL R ELBOW EXTENSION IS LIMITED BY SOFT TISSUE TIGHTNESS BEFORE  JOINT TIGHTNESS.  WITH MMT OF L VS R HAND INTRINSIC STRENGTH THE PT STILL HAS WEAKNESS OF LEFT FDP AND LEFT HAND INTRINSICS . THE PT'S LEFT GRIP IS WEAKER THAN RIGHT BUT NOT PAIN INHIBITED.    Short term goals;        Indep in HEP  Of :  Stabilization / Self care / Activity modification. ( met 01/01/22)  -Knowledge of proper body mechanics.  -Decrease in pain. Intermittent vs. constant. Worst SPS rating less than 7. ( met 5/ 23/23)  -Increase ROM / Strength: POWER GRIP NOT PAIN INHIBITED.  -Improve function. Decreased sleep disruption / Reduce HAS / Reduce radicular symptoms. ( met in part 01/01/22)    Long Term Goals:6-8Weeks  -Abolish radicular symptoms. Worst SPS rating less than 5.  -Increase ROM / Strength to [facility] to allow normal body mechanics with all mobility.  -Improve function. Restorative sleep / Abolish HAS /  tolerance of static posture not limited by pain.  -Other : Sustained use of UEs not limited by neck pain.    Plan:  CONTINUE WITH R SHOULDER AND ELBOW A/PROM WITH GOAL OF REDUCING CHRONIC NECK MYOFASCIAL PAIN .TEACH ACTIVE TRICEP STRENGTHENING WITH PROXIMA L STABILIZATION . TEACH SLEEPER'S STRETCH FOR RIGHT RC TIGHTNESS.     Total Session Time 45 and Timed code minutes 45  THERAPEUTIC EXERCISE 15 minutes and JOINT MOBILIZATION/MFR 30 minutes      [name], PT  01/07/2022, 10:20

## 2022-01-10 ENCOUNTER — Ambulatory Visit (HOSPITAL_COMMUNITY): Payer: Self-pay

## 2022-01-13 ENCOUNTER — Ambulatory Visit (HOSPITAL_COMMUNITY)
Admission: RE | Admit: 2022-01-13 | Discharge: 2022-01-13 | Disposition: A | Discharge status: Still In Hospital | Payer: 59 | Source: Ambulatory Visit

## 2022-01-13 ENCOUNTER — Other Ambulatory Visit: Payer: Self-pay

## 2022-01-13 DIAGNOSIS — M542 Cervicalgia: Secondary | ICD-10-CM | POA: Insufficient documentation

## 2022-01-13 DIAGNOSIS — T84216A Breakdown (mechanical) of internal fixation device of vertebrae, initial encounter: Secondary | ICD-10-CM | POA: Insufficient documentation

## 2022-01-13 DIAGNOSIS — M545 Low back pain, unspecified: Secondary | ICD-10-CM | POA: Insufficient documentation

## 2022-01-13 DIAGNOSIS — G8929 Other chronic pain: Secondary | ICD-10-CM | POA: Insufficient documentation

## 2022-01-13 NOTE — PT Treatment (Signed)
[facility]  Outpatient Physical Therapy  [place], [address]  [phone]  [phone]    Physical Therapy Treatment Note    Date: 01/07/2022  Patient's Name: Deborah Vega  Date of Birth: 08/20/1960      Visit #/POC:11/15 per original POC.   Authorization: 4/15  Through 04/24/22      Evaluating Physical Therapist: [name] PT  PT diagnosis/Reason for Referral: CERVICALGIA POST ACDF  Next Scheduled Physician Appointment: TBA          Subjective: Wasn't able to make her last appt because she had some gastrointestinal issues as well as allergies so she was no feeling well enough to come. No new complaints to mention today.      Objective:  MFR to neck followed by stretching over R elbow as noted below:    EXERCISE/ACTIVITY NAME REPETITIONS RESISTANCE COMPLETED THIS DOS   SUBCRANIAL INHIBITIVE DISTRACTION   NA MANUAL YES   C0/C1 FF JOINT MOB   REPEATED MANUAL, GRADE III yes   CO/C1 ACTIVE FLEXION SUPINE WITH MID CERVICAL SPINE STABILIZED   X 8 X 2 AROM NO, ADDED TO HEP 4 / 24   C0/C1 ISOMETRICS SUPINE   X 10X 2 MANUAL No, , part of HEPprogressed 5/23   LONG AXIS STRETCH OF LEVATOR SCAPULAE , POSTERIOR CERVICAL MM    NA MANUAL BILAT YES     SUPINE THORACIC MOBILITY EXERCISES WITH WAND, TRIPLANAR  30 TOTAL NA no, ADDED TO HEP WITH HANDOUTS 5/23   SUPINE PECTORAL STRETCH WITH OVERPRESSURE   30 SEC X 5 TO TOLERANCE no, ADDED TO HEP   SUPINE POSITIONAL ANTERIOR CHEST OPENER, STATIC  HOME INSTRUCTIONS HUMERAL AND ELBOW AT 90 DEGREES ABD/FLEXION WITH WAND NA no, ADDED TO HEP 11/20/21   PASSIVE STRETCH OF R ELBOW MCL NA MANUAL OVERPRESSURE TO TOLERANCE YES   RADIOULNAR JT MOB TO INCREASE WRIST SUPINATION ROM  MANUAL, GRADE III YES   SUPINE TRICEP CURLS, SHOULDER IN END RANGE FLEXION  3 LBS YES, ADDED TO HEP 6/20   ULNOHUMERAL JT MOBILIZATION  TO INCREASE EXTENSION NA GRADE II AND III YES     right infraspinatus / teres minor  stretch to increase passive IR ROM MANUAL TO TOLERANCE NO       Assessment:  Pt has decreased elbow extension on the R as well as shoulder tightness that is causing compensatory movements of the R shoulder and over recruitment of cervical musculature.     Short term goals;   Indep in HEP Of : Stabilization / Self care / Activity modification. ( met 01/01/22)  -Knowledge of proper body mechanics.  -Decrease in pain. Intermittent vs. constant. Worst SPS rating less than 7. ( met 5/ 23/23)  -Increase ROM / Strength: POWER GRIP NOT PAIN INHIBITED.  -Improve function. Decreased sleep disruption / Reduce HAS / Reduce radicular symptoms.( met in part 01/01/22)    Long Term Goals:6-8Weeks  -Abolish radicular symptoms. Worst SPS rating less than 5.  -Increase ROM / Strength to [facility] to allow normal body mechanics with all mobility.  -Improve function. Restorative sleep / Abolish HAS / tolerance of static posture not limited by pain.  -Other : Sustained use of UEs not limited by neck pain.    Plan:  CONTINUE WITH R SHOULDER AND ELBOW A/PROM WITH GOAL OF REDUCING CHRONIC NECK MYOFASCIAL PAIN .TEACH ACTIVE TRICEP STRENGTHENING WITH PROXIMA L STABILIZATION.    Total Session Time 35 and Timed code minutes  35  THERAPEUTIC EXERCISE 15 minutes and JOINT MOBILIZATION/MFR 20 minutes

## 2022-01-15 ENCOUNTER — Ambulatory Visit
Admission: RE | Admit: 2022-01-15 | Discharge: 2022-01-15 | Disposition: A | Discharge status: Still In Hospital | Payer: 59 | Source: Ambulatory Visit

## 2022-01-15 ENCOUNTER — Other Ambulatory Visit: Payer: Self-pay

## 2022-01-15 NOTE — PT Treatment (Addendum)
[facility]  Outpatient Physical Therapy  [place], [address]  [phone]  [phone]    Physical Therapy Treatment Note    Date: 01/15/2022  Patient's Name: Deborah Vega  Date of Birth: Aug 04, 1960    Visit #/POC:12 / 12 - 55  Authorization:12/15 through 04/24/22    Evaluating Physical Therapist:Kaylyne Axton PT  PT diagnosis/Reason for Referral:CERVICALGIA POST ACDF  Next Scheduled Physician Appointment:TBA    Addendum: The pt is discharged from this intervention. We have requested a referral  And insurance authorization for the pt to participate in back rehab. [name], PT        Subjective: HER NECK IS DEFINITELY BETTER. She NOTES THAT HER R ELBOW AND R SHOULDER ROM RESTRICTIONS ARE LONG STANDING. TO DATE She CANNOT TELL IF She HAS BEEN ABLE TO GAIN MOTION IN RUE. HER FUNCTION IS NOW PRIMARILY LIMITED BY LBP VS CERVICAL PAIN. Marland Kitchen She [facility]  [facility] BUT [facility] TO MEET HER [facility] PCP. She WAS REFERRED TO THERAPY BY A [facility] MD FOR HER NECK. HER AUTHORIZATION FOR CARE UNDER THE [facility] MD HAS EXPIRED.       Objective: RX FOCUSED ON ADDRESSING ROM DEFICITS OF RUE THAT CAUSE CHRONIC COMPENSATORY MECHANICS IN CERVICAL SPINE.   EXERCISE/ACTIVITY NAME REPETITIONS RESISTANCE COMPLETED THIS DOS   SUBCRANIAL INHIBITIVE DISTRACTION   NA MANUAL NO   C0/C1 FF JOINT MOB   REPEATED MANUAL, GRADE III NO   CO/C1 ACTIVE FLEXION SUPINE WITH MID CERVICAL SPINE STABILIZED   X 8 X 2 AROM NO, ADDED TO HEP 4 / 24   C0/C1 ISOMETRICS SUPINE   X 10X 2 MANUAL No, , part of HEPprogressed 5/23   LONG AXIS STRETCH OF LEVATOR SCAPULAE , POSTERIOR CERVICAL MM    NA MANUAL BILAT NO     SUPINE THORACIC MOBILITY EXERCISES WITH WAND, TRIPLANAR  30 TOTAL NA no, ADDED TO HEP WITH HANDOUTS 5/23   SUPINE PECTORAL STRETCH WITH OVERPRESSURE   30 SEC X 5 TO TOLERANCE no, ADDED TO HEP   SUPINE POSITIONAL ANTERIOR CHEST OPENER, STATIC  HOME INSTRUCTIONS HUMERAL AND  ELBOW AT 90 DEGREES ABD/FLEXION WITH WAND NA no, ADDED TO HEP 11/20/21   PASSIVE STRETCH OF R ELBOW MCL NA MANUAL OVERPRESSURE TO TOLERANCE YES   RADIOULNAR JT MOB TO INCREASE WRIST SUPINATION ROM  MANUAL, GRADE III YES   SUPINE TRICEP CURLS, SHOULDER IN END RANGE FLEXION  3 LBS NO, ADDED TO HEP 6/20   ULNOHUMERAL JT MOBILIZATION TO INCREASE EXTENSION NA GRADE II AND III YES     right infraspinatus / teres minor stretch to increase passive IR ROM MANUAL TO TOLERANCE YES,      ADDED  TO HEP: SIDE LYING R GH IR STRETCH .     Assessment: Pt has decreased elbow extension on the R as well as shoulder tightness that is causing compensatory movements of the R shoulder and over recruitment of cervical musculature.  If she can gain functional RUE ROM with her HEP of stretcing, she should be able to reduce chronic right sided neck strain to some extent. The pt has a thorough HEP of NWB stretching of cervical muscles ,subcranial isometrics, and supine thoracic mobility exercises. She has recently been instructed on a static , prolonged stretch of elbow ( extension) of of shoulder IR.  Her rehab options are limited due to multilevel cervical fusions performed in past. She has unguarded body mechanics  with  all mobility  Based on observation in clinic. Her function is now limited by LBP vs cervicalgia.       Short term goals;   Indep in HEP Of : Stabilization / Self care / Activity modification. ( met 01/01/22)  -Knowledge of proper body mechanics.( met 01/15/22)  -Decrease in pain. Intermittent vs. constant. Worst SPS rating less than 7. ( met 5/ 23/23)  -Increase ROM / Strength: POWER GRIP NOT PAIN INHIBITED. ( met)  -Improve function. Decreased sleep disruption / Reduce HAS / Reduce radicular symptoms.( met in part 01/01/22)    Long Term Goals:6-8Weeks  -Abolish radicular symptoms. Worst SPS rating less than 5.  -Increase ROM / Strength to [facility] to allow normal body mechanics with  all mobility.( met 01/15/22 based on observation of pt's body mechanics with all mobility)  -Improve function. Restorative sleep / Abolish HAS / tolerance of static posture not limited by pain. ( met in part, limited by lbp)  -Other : Sustained use of UEs not limited by neck pain.    Plan: the pt will pursue getting a  RX for therapy evaluation of her lumbar dysfunction. She is discharged from this intervention . She will need updated authorization and RX  from the [place] to continue her care.     Total Session Time 33 and Timed code minutes 33  THERAPEUTIC EXERCISE 33 minutes      [name], PT  01/15/2022, 09:38

## 2022-01-20 ENCOUNTER — Ambulatory Visit (HOSPITAL_COMMUNITY): Payer: Self-pay

## 2022-01-24 ENCOUNTER — Ambulatory Visit (HOSPITAL_COMMUNITY): Payer: Self-pay

## 2022-03-27 ENCOUNTER — Encounter (INDEPENDENT_AMBULATORY_CARE_PROVIDER_SITE_OTHER): Payer: Self-pay | Admitting: Student in an Organized Health Care Education/Training Program

## 2022-04-29 ENCOUNTER — Ambulatory Visit (INDEPENDENT_AMBULATORY_CARE_PROVIDER_SITE_OTHER): Payer: 59 | Admitting: Student in an Organized Health Care Education/Training Program

## 2022-09-05 ENCOUNTER — Ambulatory Visit (INDEPENDENT_AMBULATORY_CARE_PROVIDER_SITE_OTHER): Payer: 59 | Admitting: Student in an Organized Health Care Education/Training Program

## 2022-09-11 ENCOUNTER — Ambulatory Visit (INDEPENDENT_AMBULATORY_CARE_PROVIDER_SITE_OTHER): Payer: 59 | Admitting: Student in an Organized Health Care Education/Training Program

## 2022-09-11 ENCOUNTER — Encounter (INDEPENDENT_AMBULATORY_CARE_PROVIDER_SITE_OTHER): Payer: Self-pay

## 2022-09-11 ENCOUNTER — Ambulatory Visit (INDEPENDENT_AMBULATORY_CARE_PROVIDER_SITE_OTHER): Payer: Self-pay | Admitting: Student in an Organized Health Care Education/Training Program

## 2022-10-10 ENCOUNTER — Other Ambulatory Visit: Payer: Self-pay

## 2022-10-10 ENCOUNTER — Emergency Department (HOSPITAL_BASED_OUTPATIENT_CLINIC_OR_DEPARTMENT_OTHER): Payer: 59

## 2022-10-10 ENCOUNTER — Encounter (HOSPITAL_BASED_OUTPATIENT_CLINIC_OR_DEPARTMENT_OTHER): Payer: Self-pay

## 2022-10-10 ENCOUNTER — Emergency Department
Admission: EM | Admit: 2022-10-10 | Discharge: 2022-10-10 | Disposition: A | Discharge status: Home / Self Care | Payer: 59 | Attending: Emergency Medicine | Admitting: Emergency Medicine

## 2022-10-10 DIAGNOSIS — K219 Gastro-esophageal reflux disease without esophagitis: Secondary | ICD-10-CM | POA: Insufficient documentation

## 2022-10-10 DIAGNOSIS — Z8572 Personal history of non-Hodgkin lymphomas: Secondary | ICD-10-CM | POA: Insufficient documentation

## 2022-10-10 DIAGNOSIS — D693 Immune thrombocytopenic purpura: Secondary | ICD-10-CM | POA: Insufficient documentation

## 2022-10-10 DIAGNOSIS — K859 Acute pancreatitis without necrosis or infection, unspecified: Secondary | ICD-10-CM | POA: Insufficient documentation

## 2022-10-10 DIAGNOSIS — R1013 Epigastric pain: Secondary | ICD-10-CM

## 2022-10-10 DIAGNOSIS — K2970 Gastritis, unspecified, without bleeding: Secondary | ICD-10-CM | POA: Insufficient documentation

## 2022-10-10 DIAGNOSIS — Z9049 Acquired absence of other specified parts of digestive tract: Secondary | ICD-10-CM | POA: Insufficient documentation

## 2022-10-10 HISTORY — DX: Carrier or suspected carrier of methicillin resistant Staphylococcus aureus: Z22.322

## 2022-10-10 HISTORY — DX: Postgastric surgery syndromes: K91.1

## 2022-10-10 HISTORY — DX: Arthropathy, unspecified: M12.9

## 2022-10-10 HISTORY — DX: Calculus of kidney: N20.0

## 2022-10-10 HISTORY — DX: Non-Hodgkin lymphoma, unspecified, unspecified site: C85.90

## 2022-10-10 HISTORY — DX: Sleep apnea, unspecified: G47.30

## 2022-10-10 HISTORY — DX: Polyosteoarthritis, unspecified: M15.9

## 2022-10-10 HISTORY — DX: Gastro-esophageal reflux disease without esophagitis: K21.9

## 2022-10-10 HISTORY — DX: Lichen planus, unspecified: L43.9

## 2022-10-10 HISTORY — DX: Immune thrombocytopenic purpura: D69.3

## 2022-10-10 LAB — URINALYSIS, MACRO/MICRO
BILIRUBIN: NEGATIVE mg/dL
BLOOD: NEGATIVE mg/dL
GLUCOSE: NEGATIVE mg/dL
KETONES: NEGATIVE mg/dL
LEUKOCYTES: NEGATIVE WBCs/uL
NITRITE: NEGATIVE
PH: 6 (ref 4.6–8.0)
PROTEIN: NEGATIVE mg/dL
SPECIFIC GRAVITY: 1.025 (ref 1.003–1.035)
UROBILINOGEN: 0.2 mg/dL (ref 0.2–1.0)

## 2022-10-10 LAB — CBC WITH DIFF
BASOPHIL #: 0.01 10*3/uL (ref 0.00–0.30)
BASOPHIL %: 0 % (ref 0–3)
EOSINOPHIL #: 0.06 10*3/uL (ref 0.00–0.80)
EOSINOPHIL %: 1 % (ref 0–7)
HCT: 46.5 % (ref 37.0–47.0)
HGB: 15.4 g/dL (ref 12.5–16.0)
LYMPHOCYTE #: 1.58 10*3/uL (ref 1.10–5.00)
LYMPHOCYTE %: 25 % (ref 25–45)
MCH: 31 pg (ref 27.0–32.0)
MCHC: 33 g/dL (ref 32.0–36.0)
MCV: 93.9 fL (ref 78.0–99.0)
MONOCYTE #: 0.59 10*3/uL (ref 0.00–1.30)
MONOCYTE %: 9 % (ref 0–12)
MPV: 7.6 fL (ref 7.4–10.4)
NEUTROPHIL #: 4.18 10*3/uL (ref 1.80–8.40)
NEUTROPHIL %: 65 % (ref 40–76)
PLATELETS: 187 10*3/uL (ref 140–440)
RBC: 4.95 10*6/uL (ref 4.20–5.40)
RDW: 15.3 % — ABNORMAL HIGH (ref 11.6–14.8)
WBC: 6.4 10*3/uL (ref 4.0–10.5)

## 2022-10-10 LAB — PTT (PARTIAL THROMBOPLASTIN TIME): APTT: 29.7 seconds (ref 22.0–31.7)

## 2022-10-10 LAB — PT/INR
INR: 0.96 (ref 0.88–1.10)
PROTHROMBIN TIME: 11.1 seconds (ref 9.8–12.7)

## 2022-10-10 LAB — COMPREHENSIVE METABOLIC PANEL, NON-FASTING
ALBUMIN/GLOBULIN RATIO: 1.4 (ref 0.8–1.4)
ALBUMIN: 4 g/dL (ref 3.4–5.0)
ALKALINE PHOSPHATASE: 112 U/L (ref 46–116)
ALT (SGPT): 68 U/L (ref <=78)
ANION GAP: 11 mmol/L (ref 4–13)
AST (SGOT): 111 U/L — ABNORMAL HIGH (ref 15–37)
BILIRUBIN TOTAL: 0.5 mg/dL (ref 0.2–1.0)
BUN/CREA RATIO: 9
BUN: 8 mg/dL (ref 7–18)
CALCIUM, CORRECTED: 8.6 mg/dL
CALCIUM: 8.6 mg/dL (ref 8.5–10.1)
CHLORIDE: 107 mmol/L (ref 98–107)
CO2 TOTAL: 25 mmol/L (ref 21–32)
CREATININE: 0.93 mg/dL (ref 0.55–1.02)
ESTIMATED GFR: 70 mL/min/{1.73_m2} (ref >59)
GLOBULIN: 2.9
GLUCOSE: 104 mg/dL (ref 74–106)
OSMOLALITY, CALCULATED: 284 mOsm/kg (ref 270–290)
POTASSIUM: 4.1 mmol/L (ref 3.5–5.1)
PROTEIN TOTAL: 6.9 g/dL (ref 6.4–8.2)
SODIUM: 143 mmol/L (ref 136–145)

## 2022-10-10 LAB — LIPASE: LIPASE: 130 U/L — ABNORMAL HIGH (ref 16–77)

## 2022-10-10 MED ORDER — FAMOTIDINE 20 MG TABLET
20.0000 mg | ORAL_TABLET | Freq: Two times a day (BID) | ORAL | 0 refills | Status: AC
Start: 2022-10-10 — End: 2022-11-09

## 2022-10-10 MED ORDER — PANTOPRAZOLE 40 MG INTRAVENOUS SOLUTION
INTRAVENOUS | Status: AC
Start: 2022-10-10 — End: 2022-10-10
  Filled 2022-10-10: qty 10

## 2022-10-10 MED ORDER — ALUMINUM-MAG HYDROXIDE-SIMETHICONE 200 MG-200 MG-20 MG/5 ML ORAL SUSP
ORAL | Status: AC
Start: 2022-10-10 — End: 2022-10-10
  Filled 2022-10-10: qty 30

## 2022-10-10 MED ORDER — PANTOPRAZOLE 40 MG INTRAVENOUS SOLUTION
40.0000 mg | INTRAVENOUS | Status: AC
Start: 2022-10-10 — End: 2022-10-10
  Administered 2022-10-10: 40 mg via INTRAVENOUS

## 2022-10-10 MED ORDER — SODIUM CHLORIDE 0.9 % IV BOLUS
1000.0000 mL | INJECTION | Status: AC
Start: 2022-10-10 — End: 2022-10-10
  Administered 2022-10-10: 0 mL via INTRAVENOUS
  Administered 2022-10-10: 1000 mL via INTRAVENOUS

## 2022-10-10 MED ORDER — SODIUM CHLORIDE 0.9 % (FLUSH) INJECTION SYRINGE
10.0000 mL | INJECTION | INTRAMUSCULAR | Status: DC
Start: 2022-10-10 — End: 2022-10-10

## 2022-10-10 MED ORDER — LIDOCAINE HCL 2 % MUCOSAL SOLUTION
15.0000 mL | Freq: Once | Status: AC
Start: 2022-10-10 — End: 2022-10-10
  Administered 2022-10-10: 15 mL via ORAL

## 2022-10-10 MED ORDER — PANTOPRAZOLE 40 MG TABLET,DELAYED RELEASE
40.0000 mg | DELAYED_RELEASE_TABLET | Freq: Every day | ORAL | 0 refills | Status: AC
Start: 2022-10-10 — End: 2022-11-09

## 2022-10-10 MED ORDER — FAMOTIDINE (PF) 20 MG/2 ML INTRAVENOUS SOLUTION
20.0000 mg | INTRAVENOUS | Status: AC
Start: 2022-10-10 — End: 2022-10-10
  Administered 2022-10-10: 20 mg via INTRAVENOUS

## 2022-10-10 MED ORDER — LIDOCAINE HCL 2 % MUCOSAL SOLUTION
Status: AC
Start: 2022-10-10 — End: 2022-10-10
  Filled 2022-10-10: qty 15

## 2022-10-10 MED ORDER — HYOSCYAMINE 0.125 MG/5 ML ORAL ELIXIR
ORAL_SOLUTION | ORAL | Status: AC
Start: 2022-10-10 — End: 2022-10-10
  Filled 2022-10-10: qty 5

## 2022-10-10 MED ORDER — IOHEXOL 350 MG IODINE/ML INTRAVENOUS SOLUTION
100.0000 mL | INTRAVENOUS | Status: AC
Start: 2022-10-10 — End: 2022-10-10
  Administered 2022-10-10: 75 mL via INTRAVENOUS

## 2022-10-10 MED ORDER — FAMOTIDINE (PF) 20 MG/2 ML INTRAVENOUS SOLUTION
INTRAVENOUS | Status: AC
Start: 2022-10-10 — End: 2022-10-10
  Filled 2022-10-10: qty 2

## 2022-10-10 MED ORDER — HYOSCYAMINE 0.125 MG/5 ML ORAL ELIXIR
10.0000 mL | ORAL_SOLUTION | Freq: Once | ORAL | Status: AC
Start: 2022-10-10 — End: 2022-10-10
  Administered 2022-10-10: 5 mL via ORAL

## 2022-10-10 MED ORDER — ALUMINUM-MAG HYDROXIDE-SIMETHICONE 200 MG-200 MG-20 MG/5 ML ORAL SUSP
30.0000 mL | Freq: Once | ORAL | Status: AC
Start: 2022-10-10 — End: 2022-10-10
  Administered 2022-10-10: 30 mL via ORAL

## 2022-10-10 NOTE — ED Nurses Note (Signed)
This nurse in room to reassess for pain. Patient verbalized epigastric pain remains. Rated pain 8/10. Dr Scarlett Presto notified. Orders pending.

## 2022-10-10 NOTE — ED Nurses Note (Signed)
Patient discharged home with family. Reviewed instructions and prescriptions with patient. Questions sufficiently answered as needed. Patient verbalized understanding of instructions and prescriptions. Patient encouraged to follow up with PCP for Endoscopy referral as indicated. In the event of an emergency, patient instructed to call 911 or go to the nearest emergency room. Patient ambulated off the unit.

## 2022-10-10 NOTE — ED Triage Notes (Signed)
Pt c/o's epigastric pain radiating to RUQ and LUQ into back. Difficulty taking a deep breath, high blood pressure.  Recently stopped Keto diet. Pt seen at [facility] . EKG  sent to ER.

## 2022-10-10 NOTE — ED Nurses Note (Signed)
Patient c/o severe epigastric pain radiating to LUQ, RUQ that also radiates mid upper back since last night, more severe this AM. Patient stated hx non Hodgkin's lymphoma, buccal mucosa lichen. Stated gallbladder removed approx 25 yrs ago. Stated "this pain is different and new to me." Patient with facial grimacing upon palpation of epigastric, LUQ, RUQ. Pt seen at [facility] and was told to come to ED for elevated BP. Denies HTN, cardiac hx.

## 2022-10-10 NOTE — ED Nurses Note (Signed)
Patient placed in bed on cardiac monitor, respirations regular and unlabored on RA. Patient appears very anxious in room with husband at bedside. Denies any current needs at this time. No acute distress noted.

## 2022-10-10 NOTE — ED Provider Notes (Signed)
[facility], [facility] Emergency Department  ED Primary Provider Note  History of Present Illness   Chief Complaint   Patient presents with    Abdominal Pain    Blood Pressure Check     Deborah Vega is a 62 y.o. female who had concerns including Abdominal Pain and Blood Pressure Check.  Arrival: The patient arrived by Car complaining epigastric pain radiating to her left upper as well as right upper quadrants.  She states it is also radiating to her right and left back.  Patient states the pain has been for the last several days.  Patient is nauseous presently but denies any vomiting.  She is also having dumping syndrome which she has had for years.  She is constantly having diarrhea daily.  Patient started having incontinence of urine this morning.  She is also complaining of dysuria and increased frequency.  Patient denies any fever chills.  Patient denies any myalgias or arthralgias.  Patient recently stopped her keto diet.  She had been on the diet for many months and had lost 60 lb.  Patient denies any black or bloody stools.  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  Patient also has a history of GERD.  Patient denies ever smoking.  Patient does not drink alcohol or drugs.    HPI  Review of Systems   Review of Systems   Constitutional:  Positive for activity change and appetite change. Negative for chills and fever.   HENT:  Negative for ear pain and sore throat.    Eyes:  Negative for pain and visual disturbance.   Respiratory:  Negative for cough and shortness of breath.    Cardiovascular:  Negative for chest pain and palpitations.   Gastrointestinal:  Positive for abdominal pain, diarrhea and nausea. Negative for vomiting.   Genitourinary:  Positive for dysuria, frequency, pelvic pain and urgency. Negative for hematuria.   Musculoskeletal:  Negative for arthralgias and back pain.   Skin:  Negative for color change and rash.    Neurological:  Negative for seizures and syncope.   All other systems reviewed and are negative.     Historical Data   History Reviewed This Encounter:     Physical Exam   ED Triage Vitals [10/10/22 1055]   BP (Non-Invasive) (!) 142/94   Heart Rate 69   Respiratory Rate 18   Temperature 36.9 C (98.5 F)   SpO2 98 %   Weight 77.1 kg (170 lb)   Height 1.727 m (5\' 8" )     Physical Exam  Vitals and nursing note reviewed.   Constitutional:       General: She is not in acute distress.     Appearance: She is well-developed and normal weight.   HENT:      Head: Normocephalic and atraumatic.      Right Ear: External ear normal.      Left Ear: External ear normal.      Nose: Nose normal.      Mouth/Throat:      Mouth: Mucous membranes are dry.   Eyes:      Extraocular Movements: Extraocular movements intact.      Conjunctiva/sclera: Conjunctivae normal.      Pupils: Pupils are equal, round, and reactive to light.   Cardiovascular:      Rate and Rhythm: Normal rate and regular rhythm.      Pulses: Normal pulses.      Heart sounds:  Normal heart sounds. No murmur heard.  Pulmonary:      Effort: Pulmonary effort is normal. No respiratory distress.      Breath sounds: Normal breath sounds.   Abdominal:      Palpations: Abdomen is soft.      Tenderness: There is abdominal tenderness. There is guarding.      Comments: Positive tenderness epigastrically as well as right upper quadrant and left upper quadrant.  Positive guarding but no rebound.  Hyperactive bowel sounds throughout the abdomen.   Musculoskeletal:         General: No swelling. Normal range of motion.      Cervical back: Normal range of motion and neck supple.   Skin:     General: Skin is warm and dry.      Capillary Refill: Capillary refill takes less than 2 seconds.   Neurological:      General: No focal deficit present.      Mental Status: She is alert and oriented to person, place, and time.   Psychiatric:         Mood and Affect: Mood normal.         Behavior:  Behavior normal.         Thought Content: Thought content normal.       Patient Data     Labs Ordered/Reviewed   COMPREHENSIVE METABOLIC PANEL, NON-FASTING - Abnormal; Notable for the following components:       Result Value    AST (SGOT) 111 (*)     All other components within normal limits    Narrative:     Estimated Glomerular Filtration Rate (eGFR) is calculated using the CKD-EPI (2021) equation, intended for patients 33 years of age and older. If gender is not documented or "unknown", there will be no eGFR calculation.   LIPASE - Abnormal; Notable for the following components:    LIPASE 130 (*)     All other components within normal limits   CBC WITH DIFF - Abnormal; Notable for the following components:    RDW 15.3 (*)     All other components within normal limits    Narrative:     repeated   PT/INR - Normal    Narrative:     INR OF 2.0-3.0  RECOMMENDED FOR: PROPHYLAXIS/TREATMENT OF VENEOUS THROMBOSIS, PULMONARY EMBOLISM, PREVENTION OF SYSTEMIC EMBOLISM FROM ATRIAL FIBRILATION, MYOCARDIAL INFARCTION.    INR OF 2.5-3.5  RECOMMENDED FOR MECHANICAL PROSTHETIC HEART VALVES, RECURRENT SYSTEMIC EMBOLISM, RECURRENT MYOCARDIAL INFARCTION.     PTT (PARTIAL THROMBOPLASTIN TIME) - Normal   URINALYSIS, MACRO/MICRO - Normal   CBC/DIFF    Narrative:     The following orders were created for panel order CBC/DIFF.  Procedure                               Abnormality         Status                     ---------                               -----------         ------                     CBC WITH [id number]:[id number]  Abnormal            Final result                 Please view results for these tests on the individual orders.   URINALYSIS WITH REFLEX MICROSCOPIC AND CULTURE IF POSITIVE    Narrative:     The following orders were created for panel order URINALYSIS WITH REFLEX MICROSCOPIC AND CULTURE IF POSITIVE.  Procedure                               Abnormality         Status                     ---------                                -----------         ------                     URINALYSIS, MACRO/MICRO[[id number]]      Normal              Final result                 Please view results for these tests on the individual orders.     CT ABDOMEN PELVIS W IV CONTRAST   Final Result by Edi, Radresults In (03/22 1221)   NO ACUTE FINDINGS AT THE ABDOMEN OR PELVIS ON CONTRAST-ENHANCED CT.          One or more dose reduction techniques were used (e.g., Automated exposure control, adjustment of the mA and/or kV according to patient size, use of iterative reconstruction technique).         Radiologist location ID: [place] [facility]        Medical Decision Making  Patient is 62 year old white female complaining epigastric pain as well as right upper quadrant and left upper quadrant pain radiating to her back.  She stated that is started couple days ago in his gotten worse today.  Patient is nauseous but denies any vomiting.  Patient does have diarrhea but has a history of dumping syndrome.  Patient denies any black or bloody stools.  Patient has had a cholecystectomy in the past.  Patient has not had any surgery to her small-bowel or large-bowel.  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  Patient also has had chronic ITP.  Patient will have an IV placed with hydration.  Patient will receive Pepcid as well as Protonix IV.  Patient will have labs as well as a urinalysis done.  Depending on the results will depend on whether the patient will need to be admitted or whether she will be able to go home.  Differential includes gastritis, gastroenteritis, GERD, pancreatitis, diverticulitis, colitis.      Amount and/or Complexity of Data Reviewed  Labs: ordered.  Radiology: ordered.    Risk  OTC drugs.  Prescription drug management.             Medications Administered in the ED   NS bolus infusion 1,000 mL (0 mL Intravenous Stopped 10/10/22 1235)   pantoprazole (PROTONIX) injection  (40 mg Intravenous Given 10/10/22 1134)     And  NS flush syringe (has no administration in time range)   aluminum-magnesium hydroxide-simethicone (MAG-AL PLUS) 200-200-20 mg per 5 mL oral liquid (has no administration in time range)     And   hyoscyamine (HYOSYNE) 0.125 mg per 5 mL oral liquid (has no administration in time range)     And   lidocaine (XYLOCAINE) 2% oral topical viscous solution (has no administration in time range)   famotidine (PEPCID) 10 mg/mL injection (20 mg Intravenous Given 10/10/22 1134)   iohexol (OMNIPAQUE 350) infusion (75 mL Intravenous Given 10/10/22 1205)     Clinical Impression   Epigastric pain (Primary)   Gastritis, presence of bleeding unspecified, unspecified chronicity, unspecified gastritis type   Pancreatitis, acute       Disposition: Discharged               Clinical Impression   Epigastric pain (Primary)   Gastritis, presence of bleeding unspecified, unspecified chronicity, unspecified gastritis type   Pancreatitis, acute       Current Discharge Medication List        START taking these medications    Details   famotidine (PEPCID) 20 mg Oral Tablet Take 1 Tablet (20 mg total) by mouth Twice daily for 30 days  Qty: 60 Tablet, Refills: 0      pantoprazole (PROTONIX) 40 mg Oral Tablet, Delayed Release (E.C.) Take 1 Tablet (40 mg total) by mouth Once a day for 30 days  Qty: 30 Tablet, Refills: 0

## 2022-10-21 IMAGING — MR MRI KNEE LT W/O CONTRAST
4 of 5 series · 25 of 40 positions shown · IV contrast (gadolinium)
Comparison: No prior imaging studies of the left knee are available for comparison.

﻿EXAM:  [id number]   MRI KNEE LT W/O CONTRAST
INDICATION: Persistent left knee pain. Trauma due to fall 4 months ago.  Multiple previous surgeries of the left knee. Details of prior surgery are not known.
TECHNIQUE: Multiplanar, multisequential MRI of the left knee was performed without gadolinium contrast.

[Series 5: PD fat-sat · axial · left · 4.0mm · 0.53mm/px · z∈[-71,+59]mm · 8 of 30 slices shown (1 of 3)]
[im 1/30]
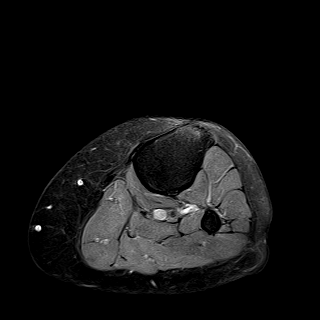
[im 5/30]
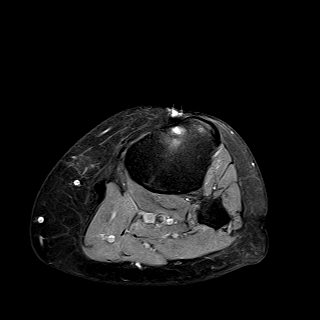
[im 9/30]
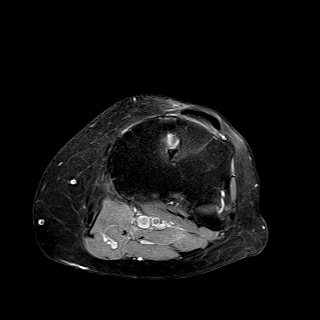
[im 13/30]
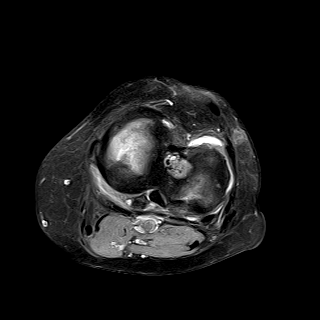
[im 17/30]
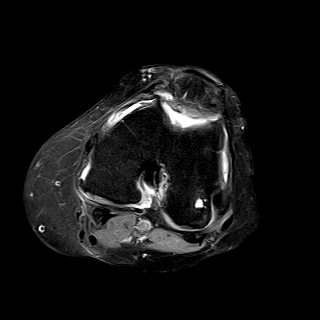
[im 21/30]
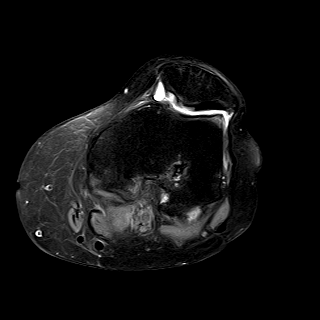
[im 25/30]
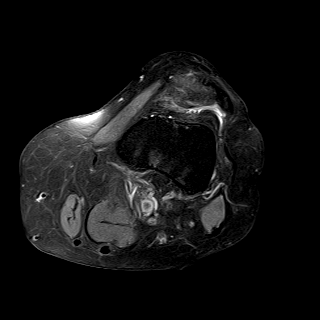
[im 30/30]
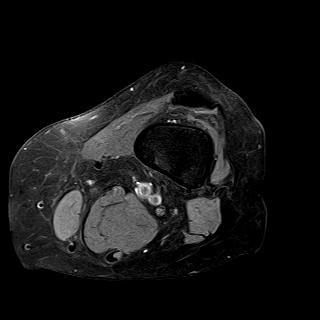

[Series 6: PD fat-sat · sagittal · left · 3.0mm · 0.31mm/px · 8 of 30 slices shown (2 of 3)]
[im 1/30]
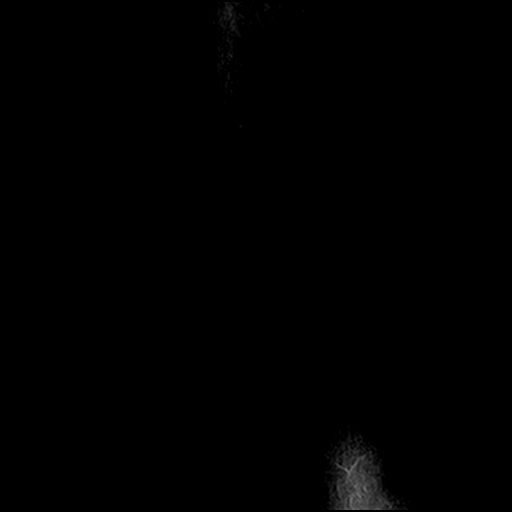
[im 5/30]
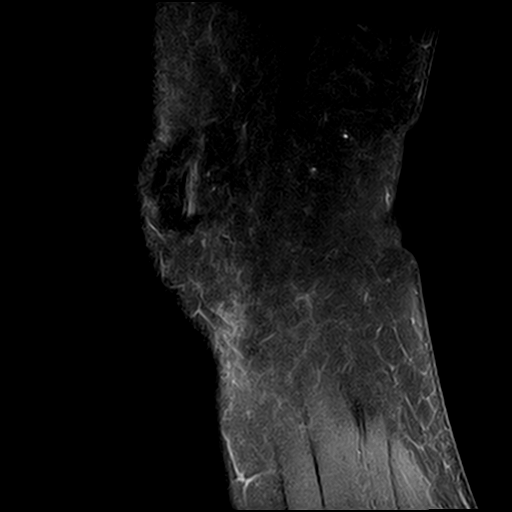
[im 9/30]
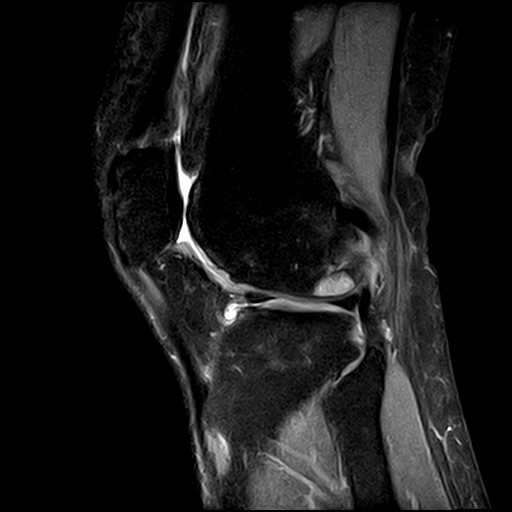
[im 13/30]
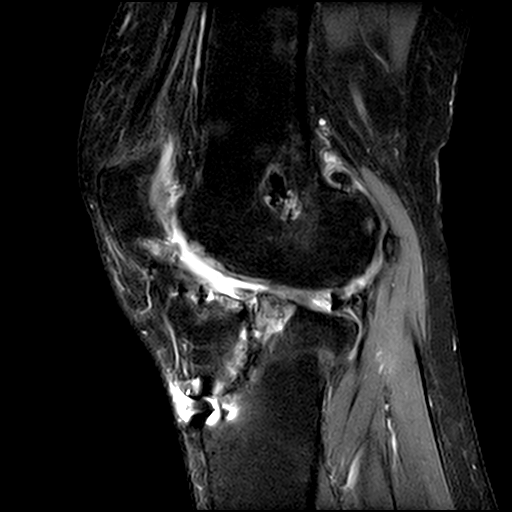
[im 17/30]
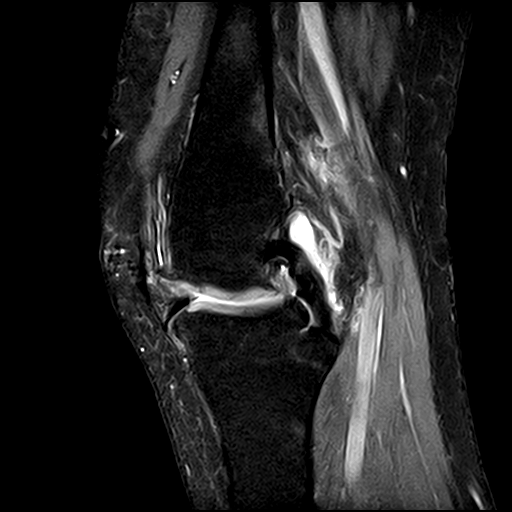
[im 21/30]
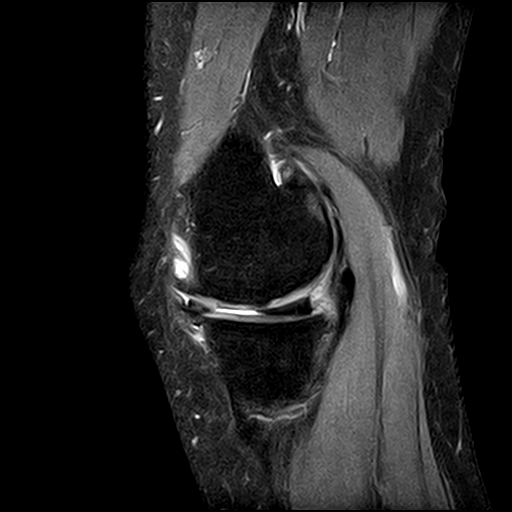
[im 25/30]
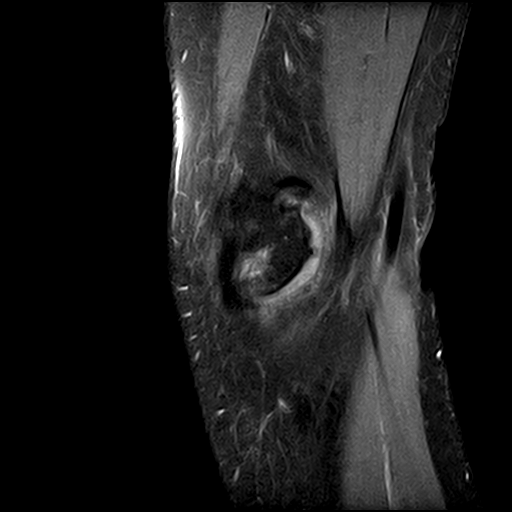
[im 30/30]
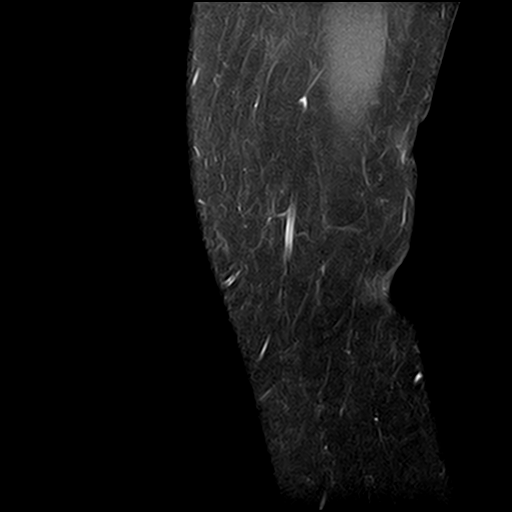

[Series 7: T1 · sagittal · left · 3.0mm · 0.31mm/px · 3 of 30 slices shown]
[im 5/30]
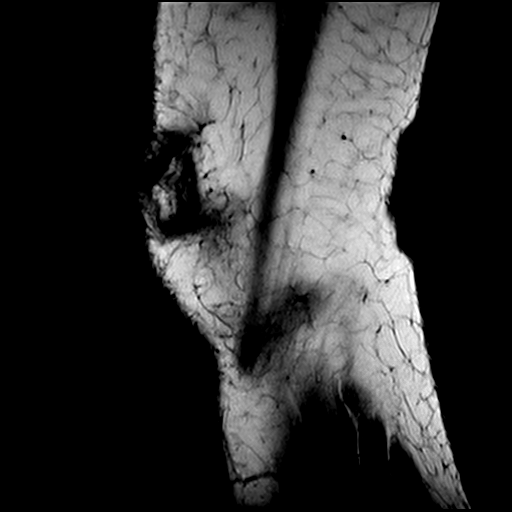
[im 17/30]
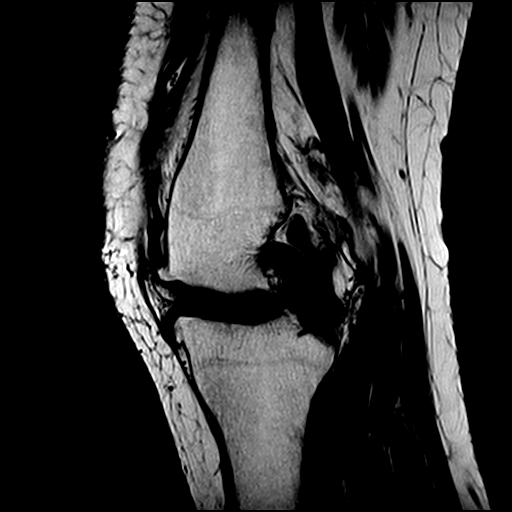
[im 25/30]
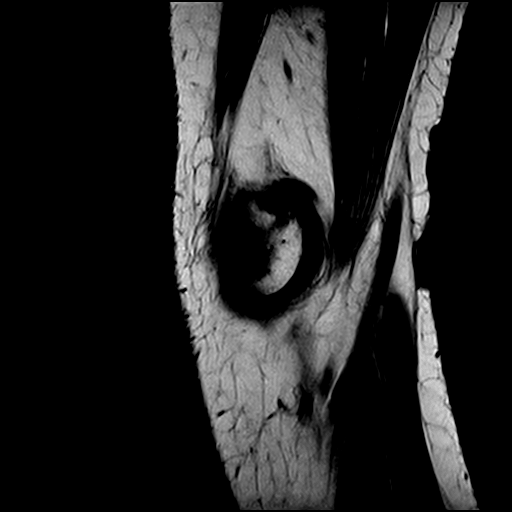

[Series 9: PD fat-sat · coronal · left · 3.0mm · 0.36mm/px · 6 of 27 slices shown (3 of 3)]
[im 1/27]
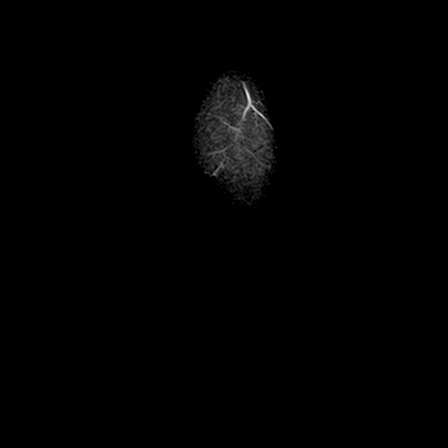
[im 4/27]
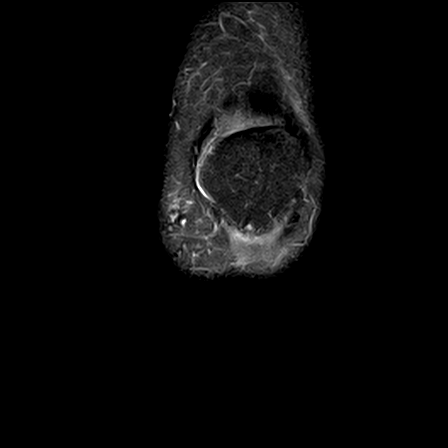
[im 8/27]
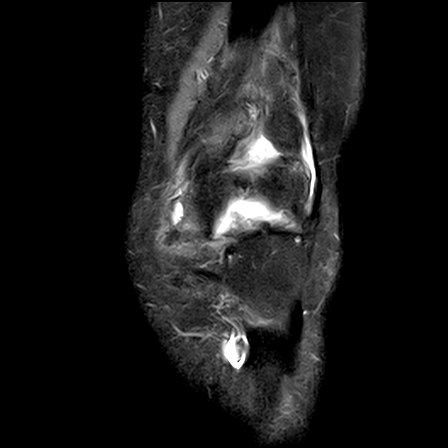
[im 12/27]
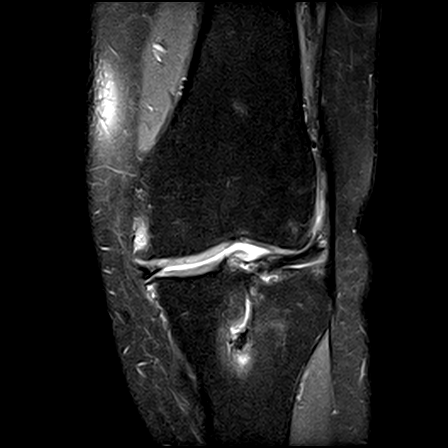
[im 15/27]
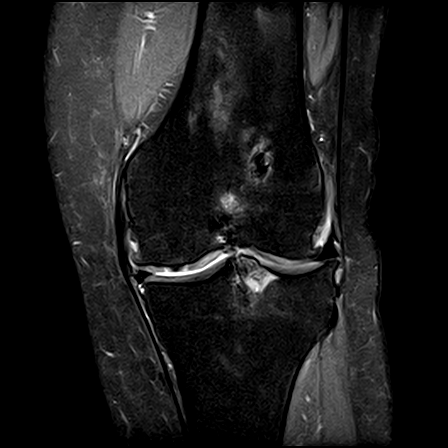
[im 23/27]
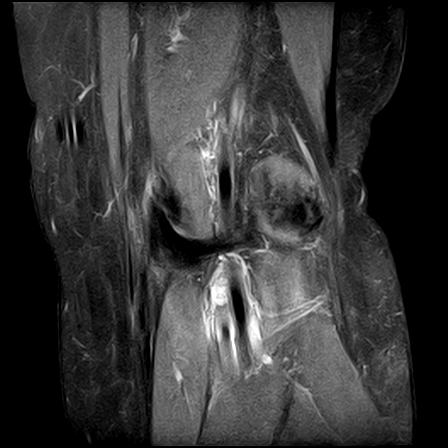

[25 of 40 positions shown; findings below may reference images not displayed]

FINDINGS: No acute fractures or bone bruise are noted at the left knee.

Postoperative changes of ACL reconstruction are noted.  The reconstructed ACL is not visualized completely in the sagittal or coronal projections suggesting possible partial disruption of the reconstructed ACL.  There is anterior translocation of proximal tibia in relation to distal femur by 7 mm. 

Lateral meniscus shows no acute findings.  Significant degenerative changes of the lateral articular cartilage are noted with subchondral cyst of the lateral femoral condyle 10 mm in diameter.

Medial meniscus shows no acute findings.  Possible postsurgical changes of partial meniscectomy of midportion of medial meniscus.  Grade 3 degenerative changes of medial articular cartilage. 

The collateral ligaments are intact. 

Grade 2 to grade 3 degenerative changes of patellar articular cartilage.  Quadriceps tendon and patellar tendon are intact.  Small effusion is noted in the knee joint.
IMPRESSION: 1. Evaluation is limited due to lack of prior imaging studies for comparison, in view of multiple prior surgeries.

2. No acute fracture or bone bruise at the knee joint.  Chronic degenerative changes of articular cartilages as described above.

3. Postoperative changes of ACL reconstruction are noted. The reconstructed ACL is not completely visualized in the sagittal or coronal projections suggesting at least partial disruption of the reconstructed ACL.  There is moderate anterior translocation of proximal tibia in relation to distal femur.  

Electronically Signed by MOOLMAN, KLPIGBB at 02-Tpr-KRKN [DATE]

## 2022-10-21 IMAGING — MR MRI ANKLE LT WO CONTAST
4 of 6 series · 23 of 40 positions shown · IV contrast (gadolinium)
Comparison: None previous available.

﻿EXAM:  [id number]   MRI ANKLE LT WO CONTAST
INDICATION: Left ankle pain after trauma 4 months ago.  Multiple prior ankle surgeries. Details of prior surgeries are not available.
TECHNIQUE: Multiplanar, multisequential MRI of the left ankle was performed without gadolinium contrast.

[Series 8: T1 · sagittal · left · 3.0mm · 0.33mm/px · 5 of 20 slices shown (1 of 3)]
[im 1/20]
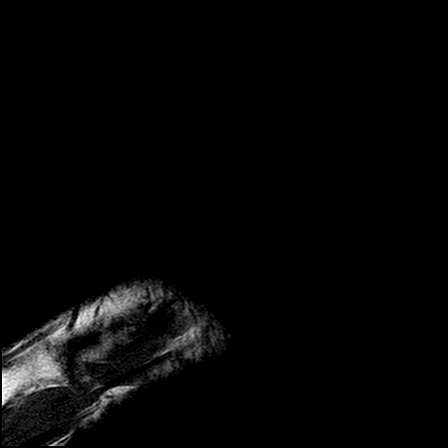
[im 5/20]
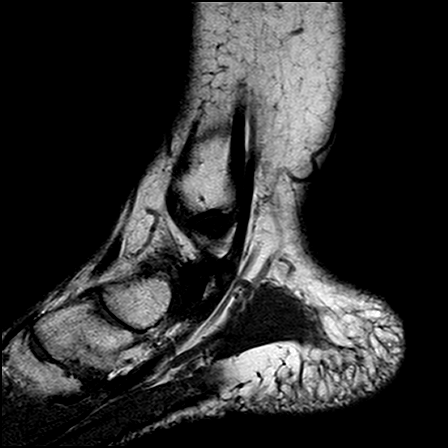
[im 10/20]
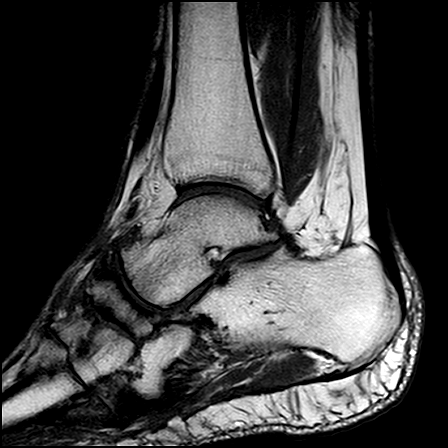
[im 15/20]
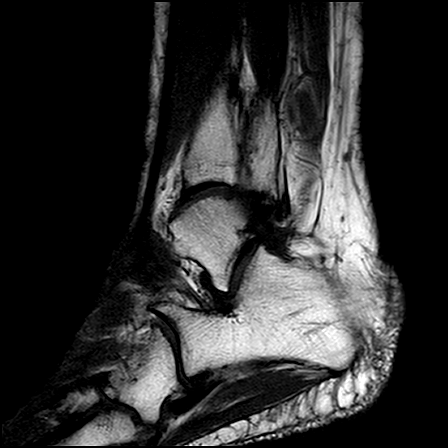
[im 20/20]
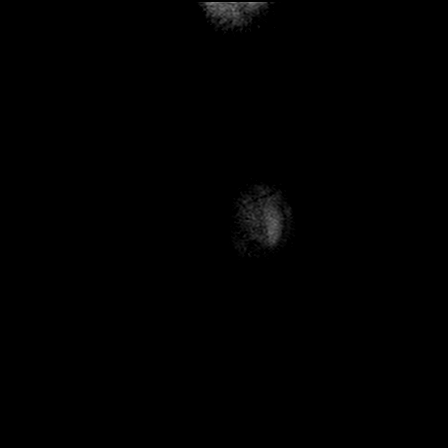

[Series 10: T1 · axial · left · 4.0mm · 0.29mm/px · z∈[-64,+40]mm · 7 of 28 slices shown (2 of 3)]
[im 1/28]
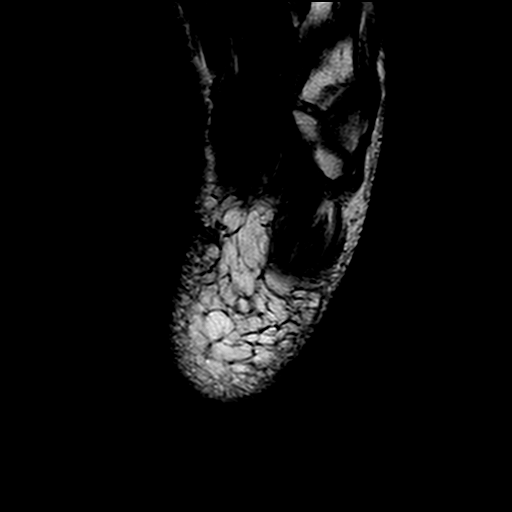
[im 4/28]
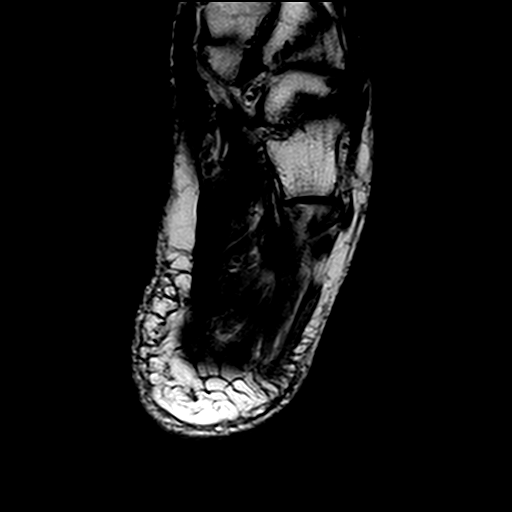
[im 8/28]
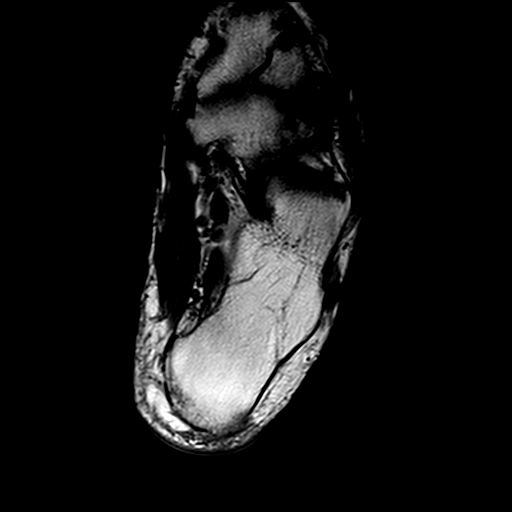
[im 12/28]
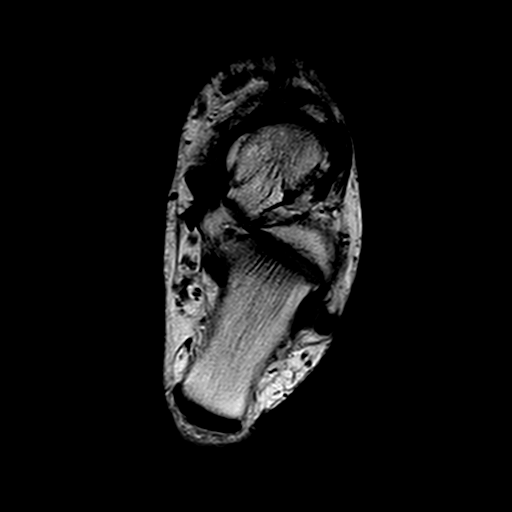
[im 16/28]
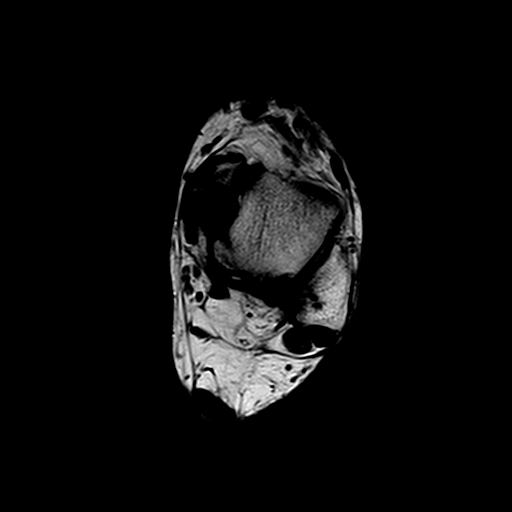
[im 20/28]
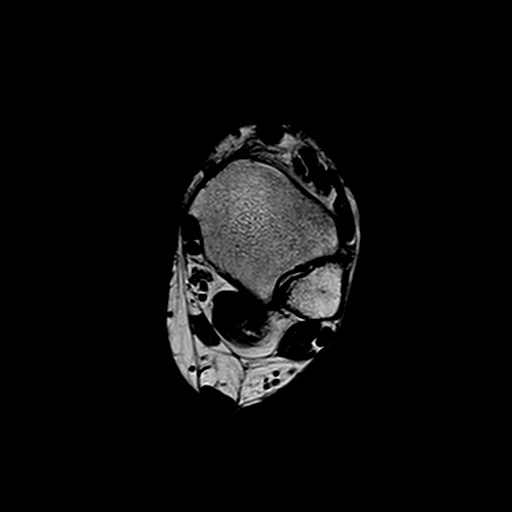
[im 24/28]
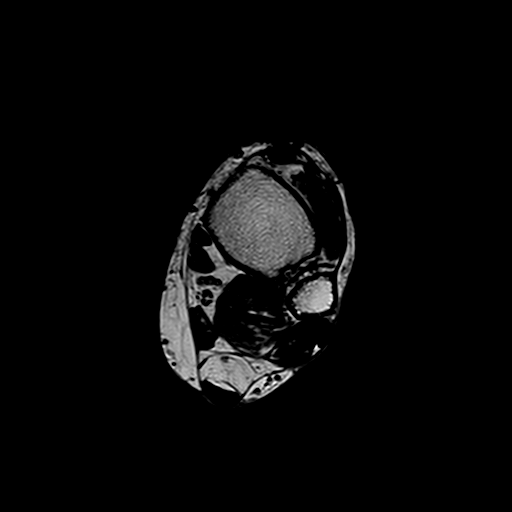

[Series 11: T2 fat-sat · axial · left · 4.0mm · 0.33mm/px · z∈[-64,+58]mm · 8 of 28 slices shown]
[im 1/28]
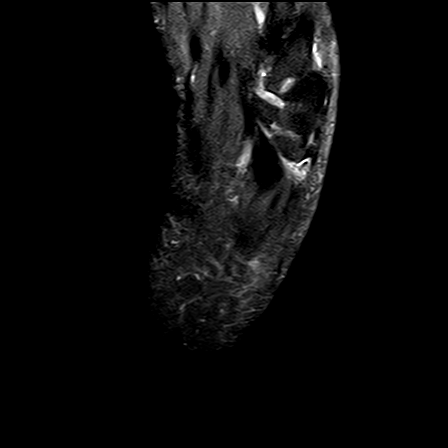
[im 4/28]
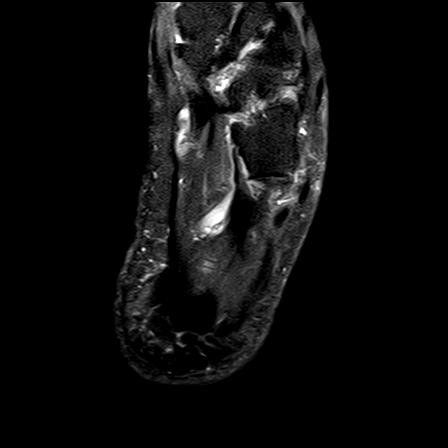
[im 8/28]
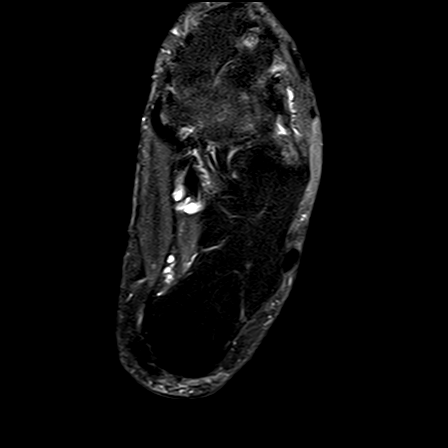
[im 12/28]
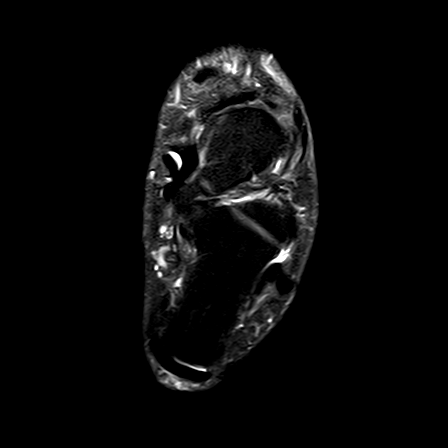
[im 16/28]
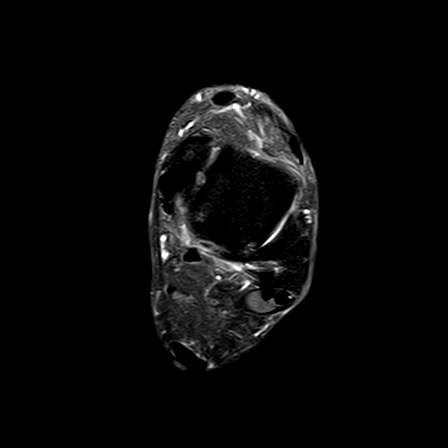
[im 20/28]
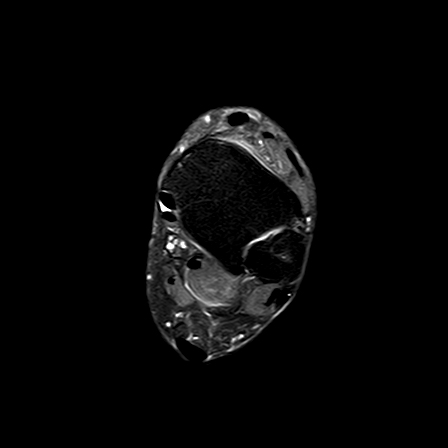
[im 24/28]
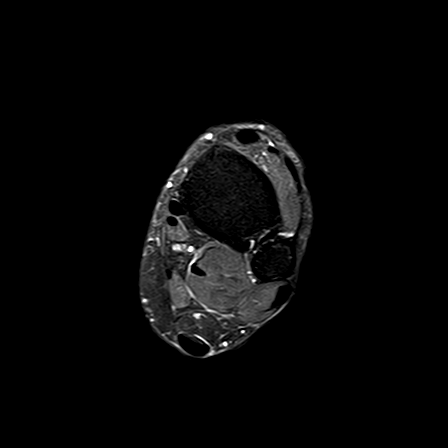
[im 28/28]
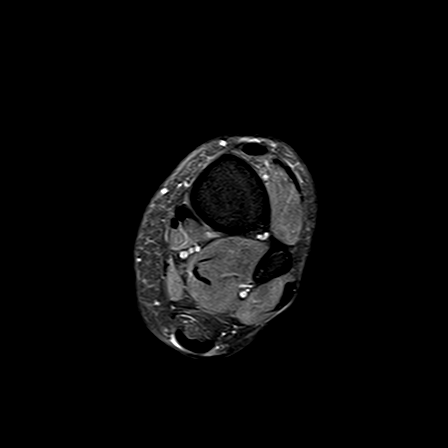

[Series 12: T1 · coronal · left · 4.0mm · 0.29mm/px · 3 of 26 slices shown (3 of 3)]
[im 5/26]
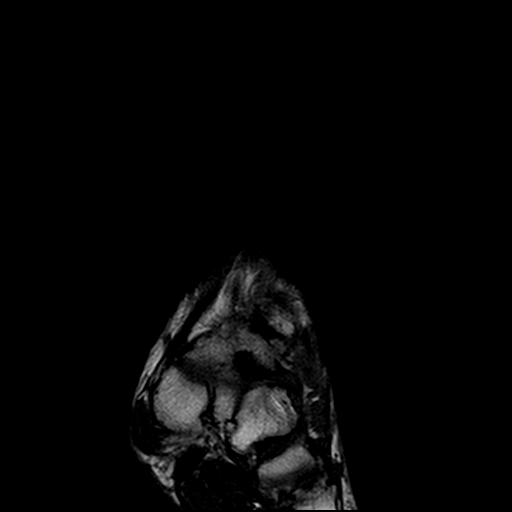
[im 13/26]
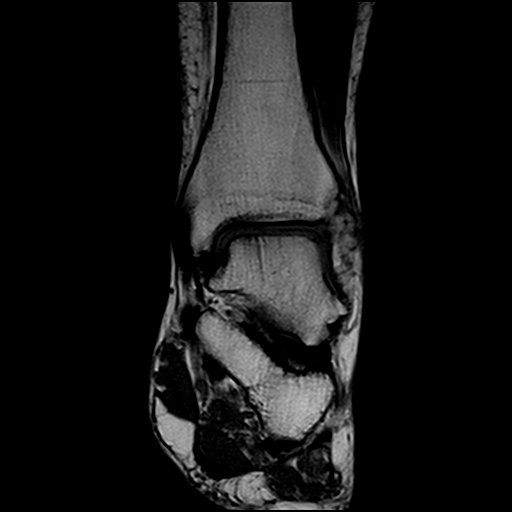
[im 21/26]
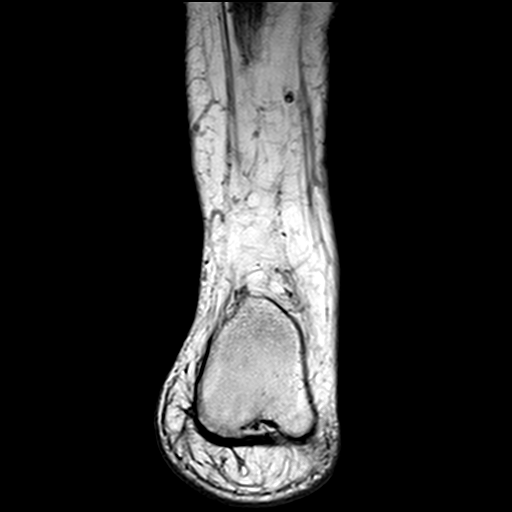

[23 of 40 positions shown; findings below may reference images not displayed]

FINDINGS: No acute fracture or bone bruise is seen at the left ankle.  The distal tibiofibular syndesmosis is intact.  There is no widening of ankle mortise. 

Mild degenerative changes of ankle joint are noted with small subchondral ganglion cyst in the superior medial corner of the talus. 

Medial collateral ligament is intact.  The anterior talofibular component of lateral ligament is not visualized indicating disruption of the ATFL. Postsurgical changes noted in this region.

The Achilles tendon is intact. Plantar fascia is intact.  Peroneal tendons and medial tendons are intact.

Degenerative arthritis of talonavicular joint and mid tarsal joints is noted.
IMPRESSION: 1. No acute bony lesions at the left ankle.  Significant degenerative changes of talonavicular joint and mid tarsal joints in the midfoot.

2. The anterior talofibular ligament is nonvisualized indicating disruption of the ligament. Postsurgical changes are noted in this area.  

Electronically Signed by BAMBERG, GUYLHERME at 02-Bpr-NJNT [DATE]

## 2024-05-11 ENCOUNTER — Other Ambulatory Visit (HOSPITAL_COMMUNITY): Payer: Self-pay

## 2024-05-11 ENCOUNTER — Encounter (HOSPITAL_COMMUNITY): Payer: Self-pay

## 2024-05-11 DIAGNOSIS — Z1231 Encounter for screening mammogram for malignant neoplasm of breast: Secondary | ICD-10-CM

## 2024-05-19 ENCOUNTER — Ambulatory Visit (HOSPITAL_COMMUNITY)

## 2024-06-01 ENCOUNTER — Other Ambulatory Visit: Payer: Self-pay

## 2024-06-01 ENCOUNTER — Encounter (HOSPITAL_COMMUNITY): Payer: Self-pay

## 2024-06-01 ENCOUNTER — Ambulatory Visit
Admission: RE | Admit: 2024-06-01 | Discharge: 2024-06-01 | Disposition: A | Discharge status: Home / Self Care | Source: Ambulatory Visit

## 2024-06-01 DIAGNOSIS — Z1231 Encounter for screening mammogram for malignant neoplasm of breast: Secondary | ICD-10-CM | POA: Insufficient documentation

## 2024-06-01 HISTORY — DX: Malignant (primary) neoplasm, unspecified: C80.1

## 2024-06-07 ENCOUNTER — Other Ambulatory Visit (HOSPITAL_COMMUNITY): Payer: Self-pay | Admitting: Family

## 2024-06-07 DIAGNOSIS — M2350 Chronic instability of knee, unspecified knee: Secondary | ICD-10-CM

## 2024-06-08 ENCOUNTER — Encounter (HOSPITAL_COMMUNITY): Payer: Self-pay

## 2024-06-08 ENCOUNTER — Ambulatory Visit
Admission: RE | Admit: 2024-06-08 | Discharge: 2024-06-08 | Disposition: A | Discharge status: Still In Hospital | Source: Ambulatory Visit

## 2024-06-08 ENCOUNTER — Other Ambulatory Visit: Payer: Self-pay

## 2024-06-08 DIAGNOSIS — M2350 Chronic instability of knee, unspecified knee: Secondary | ICD-10-CM | POA: Insufficient documentation

## 2024-06-08 DIAGNOSIS — M25362 Other instability, left knee: Secondary | ICD-10-CM | POA: Insufficient documentation

## 2024-06-08 NOTE — PT Evaluation (Addendum)
 [facility]  Outpatient Physical Therapy  [address]  [place], [address]  [phone]  (Fax) [phone]      Physical Therapy Lower Extremity Evaluation/Discharge Summary        Physical Therapy Discharge Per phone conversation, pt is IDN and compliant with HEP and her walking/squat depth is improving. She was supplied with an updated home program to continue building hip and knee strength, and she was encouraged to call back with any further needs or concerns. Plan to DC to HEP only at this time. Damien Parcel, PT 06/17/2024 11:05   Prone Hamstring Curl - Block, 3 Sets, 10 Reps, 1 Xday, 4 Xweek  Terminal Knee Extension, 3 Sets, 10 Reps, 1 Xday, 4 Xweek  Seated Hip Abduction Isometrics, 3 Sets, 10 Reps, 1 Xday, 4 Xweek  Standing Hip Abduction - Band At Kne..., 3 Sets, 10 Reps, 1 Xday, 4 Xweek  Seated Knee Flexion - MARCILLE, 1 Xday, 4 Xweek        Date: 06/08/2024  Patient's Name: Deborah Vega  Date of Birth: 11/06/1960  Physical Therapy Evaluation     Evaluating Physical Therapist: Damien Parcel, PT, DPT  PT diagnosis/Reason for Referral: L knee instability  Next Scheduled Physician Appointment: December 17th  Allergies/Contraindications: instability in cervical and lumbar form, L foot and ankle pain               SUBJECTIVE  Date of onset: >10 years ago    Mechanism of injury: no new injury- 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 Her case is complicated by severe lumbar instability as well as cervical instability for which she is awaiting cervical and lumbar fusions.    PLOF: IND     Previous episodes/treatments: none for L knee    Medications for this problem: estradiol    Diagnostic tests: L knee MRI is significant for medial and lateral meniscus degeneration and chronic ACL  disruption    Past Medical History:   Past Medical History:   Diagnosis Date    Arthropathy     Cancer (CMS HCC)     Chronic ITP (idiopathic thrombocytopenic purpura) (CMS HCC)     Dumping syndrome     Endemic generalized osteo-arthrosis     GERD (gastroesophageal reflux disease)     Kidney stones     Lichen planus     MRSA (methicillin resistant staph aureus) culture positive     Non Hodgkin's lymphoma     Sleep apnea          Past Surgical History:   Past Surgical History:   Procedure Laterality Date    HX CHOLECYSTECTOMY      HX HYSTERECTOMY     L knee ACL reconstruction and 6 surgeries in total  Upcoming cervical and lumbar   ACDF C4-C7      Patient goals: REDUCE PAIN and NORMALIZE FUNCTION    Occupation: retired    Pain location: L knee                    Pain description: ACHING    Pain frequency:  CONTINUOUS    Pain rating: Now 4   Best 4   Worst 9    Radiculopathy: none from knee     Pain increases with: POSITION CHANGE, ACTIVITY,  STAND, WALK, STAIR CLIMBING, BENDING, and LIFTING           decreases with : REST    Sensation: decreased sensation in B UE and LE     Weakness: yes L LE    Sleep affected: yes    Subjective Functional Reports:    Sitting: LIMITED    Standing: LIMITED    Walking: LIMITED    Lifting: LIMITED    Personal ADLs: standing, walking, navigating stairs, pivoting    Patient-Specific Functional Score:    Problem Score   1. STS 3   2. Walking on level surfaces 5   Total 4   Total score = sum of the activity scores/number of activities    Minimal detectable change (90% CI) for avg score = 2 points    Minimal detectable change (90% CI) for single activity score = 3 points             OBJECTIVE    AROM   left   Knee Flexion 110*   Knee Extension 0*     ROM comments tightness noted in L HS, gastroc, quads    Strength (Manual Muscle Testing per [employer] Muscle Grading system)     right left   Hip flexion  4 4   Hip extension  4 4   Hip abduction 4 4   Knee flexion  4 4   Knee extension  4 4*    Ankle DF  5 4+   Ankle PF  5 4     Strength comments pain with resisted L knee ext    Gait: antalgic on L with trunk lean towards L during L stance phase    Palpation: TTP L knee joint line, L patellar tendon    Joint mobility: hypermobility L tibiofemoral joint    Posture: WNL with L knee brace for med/lateral stability    Reflexes    Patellar 2+ B       Treatment provided:REVIEW OF POC AND GOALS WITH PATIENT, ALL QUESTIONS ANSWERED, PATIENT EDUCATION, and THERAPEUTIC EXERCISE   HEP/Access Code: Access Code: [email or web address]  URL: [email or web address]  Date: 06/08/2024  Prepared by: Damien Parcel    Exercises  - Sidelying Hip Abduction  - 1 x daily - 3 x weekly - 3 sets - 10 reps  - Supine Quad Set  - 1 x daily - 3 x weekly - 3 sets - 10 reps - 5 sec hold  - Clamshell with Resistance  - 1 x daily - 3 x weekly - 3 sets - 10 reps  - Mini Squat with Counter Support  - 1 x daily - 3 x weekly - 3 sets - 10 reps  - Side Stepping with Resistance at Thighs and Counter Support  - 1 x daily - 3 x weekly - 3 sets - 10 reps  - Heel Raises with Counter Support  - 1 x daily - 3 x weekly - 3 sets - 10 reps      EXERCISE/ACTIVITY  (HEP FOCUS) REPETITIONS RESISTANCE COMPLETED THIS DOS   S/l clams  x 10 ea green y   S/l hip abd X10 ea  y   Mini squat at table x10  y   Lateral walks with band at knees 5 steps ea green y   SLR   N-pain in back   Quad set X 10 5 sec hold y   TKE   n   Heel raises at table   n  Stool scoots   n   3-way hips with foot tap   n             ASSESSMENT    Impression: Pt presents with chronic L knee instability which has been progressively worsening and is interfering with her ability to walk and transfer. Pain is accompanied by decrease B hip strength, decreased L knee strength and ROM, and compensatory gait. Her case is complicated by instability in cervical and lumbar spines with pending surgery. She will benefit from skilled PT services to build L LE strength and mobility while working to improve  stability and reduce pain to help normalize gait and improve her ability to trasnfer. This will help to optimize her post-operative outcomes as well as improve her ability to stand, walk, and transfer prior to surgery. Visits will be scheduled as needed for the next 4 weeks to progress comprehensive HEP while protecting her spine.     Rehab potential: FAIR      Short-Term Goals: 2 Weeks   -  Patient will demonstrate improved L knee AROM to at least 0 to 110 degrees without pain to aid in ability to stand and walk.    -  Patient will demonstrate independence with progressive HEP to maximize gains from physical therapy.     -  Patient will report max 5/10 pain to aid in completion of ADLs/work duties.     Long-Term Goals: 4 Weeks     -  Patient will demonstrate improved B hip and knee strength of at least 4+/5 to aid in functional transfers.     -  Patient will demonstrate 10 sit <> stands without aggravating L knee pain  to aid in completion of ADLs.     -  Patient will demonstrate improved functional ability with Patient Specific Scale Score of at least 6.       PLAN  Patient will attend 1-2 times per week x 4 weeks. Therapy may include, but is not limited to THERAPEUTIC EXERCISES, MYOFASCIAL/JOINT MOBILIZATION, POSTURE/BODY MECHANICS, ERGONOMIC TRAINING, TRANSFER/GAIT TRAINING, HOME INSTRUCTIONS, HEAT/COLD, ELECTRICAL STIMULATION, KINESIOTAPE, and THERAPEUTIC ACTIVITY    Evaluation complexity:   Personal factors impacting POC: FREQUENT OR CHRONIC PAIN and PRE-EXISTING FUNCTIONAL LIMITATIONS   Co-morbidities impacting POC: PREVIOUS SURGERIES  Complexity of physical exam: INCLUDING MUSCULOSKELETAL SYSTEM (POSTURE, ROM, STRENGTH, HEIGHT/WEIGHT), INCLUDING ACTIVITY/MOBILITY RESTRICTIONS, and REFERRAL IS FOR A CHRONIC PROBLEM   Clinical Presentation: EVOLVING WITH CHANGING CLINICAL CHARACTERISTICS   Evaluation Complexity: MODERATE-HISTORY 1-2, EXAMINATION 3+, PRESENTATION  EVOLVING/CHANGING      Total Session Time 60,  Timed code minutes 10, and Untimed code minutes 50         Intervention minutes: EVALUATION 50 minutes and THERAPEUTIC EXERCISE 10 minutes    Damien Parcel, PT  06/08/2024, 13:42

## 2024-10-19 ENCOUNTER — Encounter (INDEPENDENT_AMBULATORY_CARE_PROVIDER_SITE_OTHER): Payer: Self-pay | Admitting: NEUROLOGY
